# Patient Record
Sex: Female | Born: 1980 | Race: White | Hispanic: No | Marital: Married | Smoking: Never smoker
Health system: Southern US, Community
[De-identification: ages and names within clinical notes are randomized; demographics above are authoritative.]

## PROBLEM LIST (undated history)

## (undated) DIAGNOSIS — R002 Palpitations: Secondary | ICD-10-CM

## (undated) DIAGNOSIS — R55 Syncope and collapse: Secondary | ICD-10-CM

## (undated) DIAGNOSIS — I471 Supraventricular tachycardia, unspecified: Secondary | ICD-10-CM

## (undated) DIAGNOSIS — G47419 Narcolepsy without cataplexy: Secondary | ICD-10-CM

## (undated) DIAGNOSIS — C719 Malignant neoplasm of brain, unspecified: Secondary | ICD-10-CM

## (undated) DIAGNOSIS — D496 Neoplasm of unspecified behavior of brain: Secondary | ICD-10-CM

## (undated) DIAGNOSIS — I951 Orthostatic hypotension: Secondary | ICD-10-CM

## (undated) DIAGNOSIS — I340 Nonrheumatic mitral (valve) insufficiency: Secondary | ICD-10-CM

## (undated) DIAGNOSIS — R0602 Shortness of breath: Secondary | ICD-10-CM

## (undated) DIAGNOSIS — E039 Hypothyroidism, unspecified: Secondary | ICD-10-CM

## (undated) DIAGNOSIS — R42 Dizziness and giddiness: Secondary | ICD-10-CM

## (undated) DIAGNOSIS — R569 Unspecified convulsions: Secondary | ICD-10-CM

## (undated) HISTORY — DX: Nonrheumatic mitral (valve) insufficiency: I34.0

## (undated) HISTORY — DX: Dizziness and giddiness: R42

## (undated) HISTORY — DX: Palpitations: R00.2

## (undated) HISTORY — DX: Hypothyroidism, unspecified: E03.9

## (undated) HISTORY — DX: Syncope and collapse: R55

## (undated) HISTORY — DX: Shortness of breath: R06.02

## (undated) HISTORY — DX: Malignant neoplasm of brain, unspecified: C71.9

## (undated) HISTORY — DX: Orthostatic hypotension: I95.1

## (undated) HISTORY — DX: Narcolepsy without cataplexy: G47.419

---

## 2005-03-25 ENCOUNTER — Ambulatory Visit: Payer: Self-pay | Admitting: Internal Medicine

## 2007-12-05 ENCOUNTER — Emergency Department (HOSPITAL_BASED_OUTPATIENT_CLINIC_OR_DEPARTMENT_OTHER): Admission: EM | Admit: 2007-12-05 | Discharge: 2007-12-05 | Payer: Self-pay | Admitting: Emergency Medicine

## 2008-01-23 ENCOUNTER — Encounter: Admission: RE | Admit: 2008-01-23 | Discharge: 2008-01-23 | Payer: Self-pay | Admitting: Obstetrics and Gynecology

## 2008-05-28 ENCOUNTER — Emergency Department (HOSPITAL_BASED_OUTPATIENT_CLINIC_OR_DEPARTMENT_OTHER): Admission: EM | Admit: 2008-05-28 | Discharge: 2008-05-28 | Payer: Self-pay | Admitting: Emergency Medicine

## 2008-08-02 ENCOUNTER — Emergency Department (HOSPITAL_BASED_OUTPATIENT_CLINIC_OR_DEPARTMENT_OTHER): Admission: EM | Admit: 2008-08-02 | Discharge: 2008-08-02 | Payer: Self-pay | Admitting: Emergency Medicine

## 2010-07-27 LAB — BASIC METABOLIC PANEL
BUN: 10 mg/dL (ref 6–23)
CO2: 27 mEq/L (ref 19–32)
Calcium: 9.8 mg/dL (ref 8.4–10.5)
Creatinine, Ser: 0.8 mg/dL (ref 0.4–1.2)
GFR calc non Af Amer: >60 mL/min (ref >60)
Glucose, Bld: 90 mg/dL (ref 70–99)
Potassium: 4.4 mEq/L (ref 3.5–5.1)

## 2010-07-27 LAB — URINALYSIS, ROUTINE W REFLEX MICROSCOPIC
Bilirubin Urine: NEGATIVE
Hgb urine dipstick: NEGATIVE
Specific Gravity, Urine: 1.002 — ABNORMAL LOW (ref 1.005–1.030)

## 2010-07-27 LAB — GLUCOSE, CAPILLARY: Glucose-Capillary: 92 mg/dL (ref 70–99)

## 2010-12-03 ENCOUNTER — Emergency Department (HOSPITAL_BASED_OUTPATIENT_CLINIC_OR_DEPARTMENT_OTHER)
Admission: EM | Admit: 2010-12-03 | Discharge: 2010-12-03 | Disposition: A | Discharge status: Home / Self Care | Payer: 59 | Attending: Emergency Medicine | Admitting: Emergency Medicine

## 2010-12-03 DIAGNOSIS — N939 Abnormal uterine and vaginal bleeding, unspecified: Secondary | ICD-10-CM

## 2010-12-03 DIAGNOSIS — N898 Other specified noninflammatory disorders of vagina: Secondary | ICD-10-CM | POA: Insufficient documentation

## 2010-12-03 DIAGNOSIS — R42 Dizziness and giddiness: Secondary | ICD-10-CM | POA: Insufficient documentation

## 2010-12-03 HISTORY — DX: Supraventricular tachycardia, unspecified: I47.10

## 2010-12-03 HISTORY — DX: Neoplasm of unspecified behavior of brain: D49.6

## 2010-12-03 HISTORY — DX: Supraventricular tachycardia: I47.1

## 2010-12-03 HISTORY — DX: Unspecified convulsions: R56.9

## 2010-12-03 LAB — WET PREP, GENITAL
Clue Cells Wet Prep HPF POC: NONE SEEN
Trich, Wet Prep: NONE SEEN
Yeast Wet Prep HPF POC: NONE SEEN

## 2010-12-03 LAB — PREGNANCY, URINE: Preg Test, Ur: NEGATIVE

## 2010-12-03 MED ORDER — NORGESTREL-ETHINYL ESTRADIOL 0.3-30 MG-MCG PO TABS: 1.0000 | ORAL_TABLET | Freq: Every day | ORAL | Status: DC

## 2010-12-03 NOTE — Discharge Instructions (Signed)
Dysfunctional Uterine Bleeding (DUB)   (Abnormal Uterine Bleeding [AUB])   Normally menstrual periods begin between ages 20 to 32 in young women. A normal menstrual cycle/period may begin every 23 days up to 35 days and lasts from 1 to 7 days. Around 12 to 14 days before your menstrual period starts, ovulation (ovary produces an egg) occurs. When counting the time between menstrual periods, count from the first day of bleeding of the previous period to the first day of bleeding of the next period.   Dysfunctional (abnormal) uterine bleeding is bleeding that is different from a normal menstrual period. Your periods may come earlier or later than usual. They may be lighter, have blood clots or be heavier. You may have bleeding between periods, or you may skip one period or more. You may have bleeding after sexual intercourse, bleeding after menopause, or no menstrual period.   CAUSES   Pregnancy (normal, miscarriage, tubal).   IUDs (intrauterine device, birth control).   Birth control pills.   Hormone treatment.   Menopause.   Infection of the cervix.   Blood clotting problems.   Infection of the inside lining of the uterus.   Endometriosis, inside lining of the uterus growing in the pelvis and other female organs.   Adhesions (scar tissue) inside the uterus.   Obesity or severe weight loss.  Uterine polyps inside the uterus.   Cancer of the vagina, cervix, or uterus.   Ovarian cysts or polycystic ovary syndrome.   Medical problems (diabetes, thyroid disease).   Uterine fibroids (noncancerous tumor).   Problems with your female hormones.   Endometrial hyperplasia, very thick lining and enlarged cells inside of the uterus.   Medicines that interfere with ovulation.   Radiation to the pelvis or abdomen.   Chemotherapy.    DIAGNOSIS   Your doctor will discuss the history of your menstrual periods, medicines you are taking, changes in your weight, stress in your life, and any medical problems you may have.   Your doctor  will do a physical and pelvic examination.   Your doctor may want to perform certain tests to make a diagnosis, such as:   Pap test.   Blood tests.   Cultures for infection.   CT scan.  Ultrasound.   Hysteroscopy.   Laparoscopy.   MRI.  Hysterosalpingography.   D and C.   Endometrial biopsy.    TREATMENT   Treatment will depend on the cause of the dysfunctional uterine bleeding (DUB). Treatment may include:   Observing your menstrual periods for a couple of months.   Prescribing medicines for medical problems, including:   Antibiotics.   Hormones.   Birth control pills.   Removing an IUD (intrauterine device, birth control).   Surgery:   D and C (scrape and remove tissue from inside the uterus).   Laparoscopy (examine inside the abdomen with a lighted tube).   Uterine ablation (destroy lining of the uterus with electrical current, laser, heat, or freezing).   Hysteroscopy (examine cervix and uterus with a lighted tube).   Hysterectomy (remove the uterus).   HOME CARE INSTRUCTIONS   If medicines were prescribed, take exactly as directed. Do not change or switch medicines without consulting your caregiver.   Long term heavy bleeding may result in iron deficiency. Your caregiver may have prescribed iron pills. They help replace the iron that your body lost from heavy bleeding. Take exactly as directed.   Do not take aspirin or medicines that contain aspirin one week  before or during your menstrual period. Aspirin may make the bleeding worse.   If you need to change your sanitary pad or tampon more than once every 2 hours, stay in bed with your feet elevated and a cold pack on your lower abdomen. Rest as much as possible, until the bleeding stops or slows down.   Eat well-balanced meals. Eat foods high in iron. Examples are:   Leafy green vegetables.   Whole-grain breads and cereals.   Eggs.  Meat.   Liver.    Do not try to lose weight until the abnormal bleeding has stopped and your blood iron level is back to normal.  Do not lift more than ten pounds or do strenuous activities when you are bleeding.   For a couple of months, make note on your calendar, marking the start and ending of your period, and the type of bleeding (light, medium, heavy, spotting, clots or missed periods). This is for your caregiver to better evaluate your problem.   SEEK MEDICAL CARE IF:   You develop nausea (feeling sick to your stomach) and vomiting, dizziness, or diarrhea while you are taking your medicine.   You are getting lightheaded or weak.   You have any problems that may be related to the medicine you are taking.   You develop pain with your DUB.   You want to remove your IUD.   You want to stop or change your birth control pills or hormones.   You have any type of abnormal bleeding mentioned above.   You are over 41 years old and have not had a menstrual period yet.   You are 30 years old and you are still having menstrual periods.   You have any of the symptoms mentioned above.   You develop a rash.   SEEK IMMEDIATE MEDICAL CARE IF:   An oral temperature above 101 develops.   You develop chills.   You are changing your sanitary pad or tampon more than once an hour.   You develop abdominal pain.   You pass out or faint.   Document Released: 03/25/2000 Document Re-Released: 06/22/2009   [facility] Patient Information 2011 [place], [place].

## 2010-12-03 NOTE — ED Notes (Signed)
Pt reports heavy vaginal bleeding that started this am.

## 2010-12-03 NOTE — ED Provider Notes (Signed)
History     CSN: [id number] Arrival date & time: 12/03/2010  9:00 AM  Chief Complaint  Patient presents with  . Vaginal Bleeding   HPI Comments: The patient reports that she had a normal menstrual cycle one month ago and appropriately began her menses yesterday. This morning she woke up and her clothes and bed were fairly saturated with menstrual bleeding. It seemed to slow down and she cleaned up and went back to sleep. She then got up again this morning in order to get ready for work and again was fully saturated. She did take a shower and she notes that heavy bleeding and clots continued to fall out while she was showering. She reports feeling slightly lightheaded but no significant dizziness or feeling faint. She did not have any shortness of breath or diaphoresis or nausea. She reports that usually her menses last approximately 5 days with moderate to heavy bleeding. She notes that she has a family history of heavy menstrual cycles and no family history of bleeding disorders or platelet problems. She denies any abdominal pain, tenderness or back pain. She denies any cramps. Her OB history includes 2 pregnancies and 2 children by vaginal birth. She had a Pap smear and ultrasound approximately 6 months ago that she reports words okay. She was told by her OB/GYN physician Dr. Renaldo Fiddler that she does have possibly a fibroid. The patient used to take birth control pills to help control her ear leading but she discontinued due to side effects of weight gain and moodiness. Patient denies smoking or drinking excessive alcohol. At this time prior to arrival the bleeding did slow down significantly. However at home she did use a tampon and bled through it very quickly.  Patient is a 30 y.o. female presenting with vaginal bleeding. The history is provided by the patient.  Vaginal Bleeding Pertinent negatives include no headaches.    Past Medical History  Diagnosis Date  . Seizure   . Brain tumor   . SVT  (supraventricular tachycardia)     History reviewed. No pertinent past surgical history.  No family history on file.  History  Substance Use Topics  . Smoking status: Never Smoker   . Smokeless tobacco: Not on file  . Alcohol Use: Yes     occasional    OB History    Grav Para Term Preterm Abortions TAB SAB Ect Mult Living                  Review of Systems  Constitutional: Negative.   Respiratory: Negative.   Cardiovascular: Negative.   Genitourinary: Positive for vaginal bleeding and menstrual problem. Negative for dysuria, flank pain, vaginal discharge, vaginal pain and pelvic pain.  Musculoskeletal: Negative for back pain.  Neurological: Positive for light-headedness. Negative for dizziness, weakness and headaches.  Hematological: Does not bruise/bleed easily.    Physical Exam  BP 129/85  Pulse 67  Temp(Src) 98.4 F (36.9 C) (Oral)  Resp 16  Ht 5\' 6"  (1.676 m)  Wt 140 lb (63.504 kg)  BMI 22.60 kg/m2  SpO2 100%  LMP 12/03/2010  Physical Exam  Constitutional: She is oriented to person, place, and time. She appears well-developed and well-nourished. No distress.  HENT:  Head: Normocephalic and atraumatic.  Pulmonary/Chest: Effort normal.  Abdominal: Bowel sounds are normal. There is no tenderness. There is no guarding.  Genitourinary: Cervix exhibits friability. Cervix exhibits no discharge. Right adnexum displays no tenderness. Left adnexum displays no tenderness. No signs of injury around the  vagina. No vaginal discharge found.       Small amount of mucous bleeding noted from os.  Mild to moderate amount of blood in vaginal vault.  Evidence of blood around her pelvis from where her clothes had been saturated with blood.  Neurological: She is alert and oriented to person, place, and time.    ED Course  Procedures  MDM The patient has stable vital signs. On exam the patient's vaginal bleeding has decreased significantly. Her urine pregnancy was negative. I  discussed the patient's case with her regular OB/GYN doctor at. Dr. Renaldo Fiddler had seen her for similar problem 6 months ago the patient had declined birth control to time. We'll put her back on a monophasic birth control for now. Dr. Renaldo Fiddler can see the patient in one to 2 weeks for followup to see her she is doing at that time. He she knows to return for any worsening symptoms or to call Dr. Renaldo Fiddler for followup.      Gavin Pound. Makinzy Cleere, MD 12/03/10 1053

## 2012-01-18 ENCOUNTER — Other Ambulatory Visit: Payer: Self-pay | Admitting: Obstetrics and Gynecology

## 2012-01-18 DIAGNOSIS — N6310 Unspecified lump in the right breast, unspecified quadrant: Secondary | ICD-10-CM

## 2012-01-18 DIAGNOSIS — N644 Mastodynia: Secondary | ICD-10-CM

## 2012-01-23 ENCOUNTER — Ambulatory Visit
Admission: RE | Admit: 2012-01-23 | Discharge: 2012-01-23 | Disposition: A | Discharge status: Home / Self Care | Payer: 59 | Source: Ambulatory Visit | Attending: Obstetrics and Gynecology | Admitting: Obstetrics and Gynecology

## 2012-01-23 DIAGNOSIS — N644 Mastodynia: Secondary | ICD-10-CM

## 2012-01-23 DIAGNOSIS — N6310 Unspecified lump in the right breast, unspecified quadrant: Secondary | ICD-10-CM

## 2013-08-02 ENCOUNTER — Ambulatory Visit (INDEPENDENT_AMBULATORY_CARE_PROVIDER_SITE_OTHER): Payer: PRIVATE HEALTH INSURANCE | Admitting: Pulmonary Disease

## 2013-08-02 ENCOUNTER — Encounter (INDEPENDENT_AMBULATORY_CARE_PROVIDER_SITE_OTHER): Payer: Self-pay

## 2013-08-02 ENCOUNTER — Encounter: Payer: Self-pay | Admitting: Pulmonary Disease

## 2013-08-02 VITALS — BP 110/62 | HR 61 | Temp 98.1°F | Ht 65.0 in | Wt 130.0 lb

## 2013-08-02 DIAGNOSIS — G4710 Hypersomnia, unspecified: Secondary | ICD-10-CM

## 2013-08-02 DIAGNOSIS — G47419 Narcolepsy without cataplexy: Secondary | ICD-10-CM | POA: Insufficient documentation

## 2013-08-02 MED ORDER — ARMODAFINIL 150 MG PO TABS: 150.0000 mg | ORAL_TABLET | Freq: Every day | ORAL | Status: DC

## 2013-08-02 NOTE — Patient Instructions (Signed)
Will try you on nuvigil 150mg  one each am to see if it helps your symptoms.  Please call me in a few weeks to give me some feedback. Make sure you keep close followup with [facility] for management of your brain tumor.  followup with me again in 43mos, but call if having issues.

## 2013-08-02 NOTE — Progress Notes (Signed)
Subjective:    Patient ID: Shirley Anderson, female    DOB: [birth year], 33 y.o.   MRN: [id number]  HPI The patient is a 33 year old female who I've been asked to see for management of significant daytime sleepiness. The patient tells me that she had a workup at [facility] in 2010 where she had a normal overnight sleep study, followed by an abnormal MSLT consistent with narcolepsy.  She was treated with a stimulant medication that she believes helped quite a bit, but was discontinued because of concerns about its effect on her history of cardiac arrhythmia. 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 This was last imaged in February of this year, and felt to have no change.  The patient tells me the diagnosis is not definitive, and that she does have a history of a seizure disorder that may be related to the brain tumor.  The patient has had a recent MSLT that showed a mean sleep latency of 7.2 minutes, and achieved REM and all of naps. She tells me that she has had sleepiness for only one year, but it has accelerated over the last 6 months. She does not feel that she had sleepiness back in her teenage years and during her 57s, but did feel like she had less energy. The patient denies having any issue with snoring or witnessed pauses in her breathing during sleep. She does not kick during the night according to her husband, but her husband has noted about 2 times a year where she has abnormal behaviors. She is not aware of any seizures occurring during the night. She has not had a recent NPSG.  She awakens a few times during the night, and feels fairly rested in the mornings upon arising. However, quickly she begins to develop significant sleepiness with any period of inactivity. She will often go out to her car to take a nap during her lunch break. She believes this is interfering with her concentration and efficiency at work. She  also has a difficult time staying awake watching television in the evening with her family. The patient's weight has been stable over the last few years, and her Epworth score today is very abnormal at 17.   Sleep Questionnaire What time do you typically go to bed?( Between what hours) 8-9:00 p.m 8-9:00 p.m at 1121 on 08/02/13 by Lu Duffel How long does it take you to fall asleep? instantly instantly at 1121 on 08/02/13 by Lu Duffel How many times during the night do you wake up? 5 5 at 1121 on 08/02/13 by Lu Duffel What time do you get out of bed to start your day? 0600 0600 at 1121 on 08/02/13 by Lu Duffel Do you drive or operate heavy machinery in your occupation? No No at 1121 on 08/02/13 by Lu Duffel How much has your weight changed (up or down) over the past two years? (In pounds) 0 oz (0 kg) 0 oz (0 kg) at 1121 on 08/02/13 by Lu Duffel Have you ever had a sleep study before? Yes Yes at 1121 on 08/02/13 by Lu Duffel If yes, location of study? [facility] at 1121 on 08/02/13 by Lu Duffel If yes, date of study? 06/2013 06/2013 at 1121 on 08/02/13 by Lu Duffel Do you currently use CPAP? No No at 1121 on 08/02/13 by Lu Duffel  Do you wear oxygen at any time? No No at 1121 on 08/02/13 by Lu Duffel   Review of Systems  Constitutional: Negative for fever and unexpected weight change.  HENT: Negative for congestion, dental problem, ear pain, nosebleeds, postnasal drip, rhinorrhea, sinus pressure, sneezing, sore throat and trouble swallowing.   Eyes: Negative for redness and itching.  Respiratory: Negative for cough, chest tightness, shortness of breath and wheezing.   Cardiovascular: Positive for palpitations. Negative for leg swelling.  Gastrointestinal: Negative for nausea and vomiting.  Genitourinary: Negative for dysuria.  Musculoskeletal: Negative for joint swelling.   Skin: Negative for rash.  Neurological: Negative for headaches.  Hematological: Does not bruise/bleed easily.  Psychiatric/Behavioral: Negative for dysphoric mood. The patient is not nervous/anxious.        Objective:   Physical Exam Constitutional:  Well developed, no acute distress  HENT:  Nares patent without discharge  Oropharynx without exudate, palate and uvula are mildly elongated.  Eyes:  Perrla, eomi, no scleral icterus  Neck:  No JVD, no TMG  Cardiovascular:  Normal rate, regular rhythm, no rubs or gallops.  No murmurs        Intact distal pulses  Pulmonary :  Normal breath sounds, no stridor or respiratory distress   No rales, rhonchi, or wheezing  Abdominal:  Soft, nondistended, bowel sounds present.  No tenderness noted.   Musculoskeletal:  No lower extremity edema noted.  Lymph Nodes:  No cervical lymphadenopathy noted  Skin:  No cyanosis noted  Neurologic:  Alert, appropriate, moves all 4 extremities without obvious deficit.         Assessment & Plan:

## 2013-08-02 NOTE — Assessment & Plan Note (Addendum)
The patient presents with persistent daytime sleepiness that is interfering with her quality of life and work efficiency. She has had a sleep evaluation in the past that was suspicious for narcolepsy, and her recent MS LT was abnormal as well. She did not have an NPS G. prior to her recent daytime testing, but she has no history to suggest sleep disordered breathing, movement disorders, or obvious nocturnal seizures (though this will need to be kept in mind going forward). 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 She has a grade 2 glioma by her history in the hypothalamus that is being followed closely by [facility]. It was last imaged in February of this year with no change in size. She really doesn't have a history for sleepiness dating back to her teens and 38s, and my suspicion is she does not have genetic-based disease. Will try the patient on Nuvigil to see if that helps her daytime symptoms, also stressed to her the importance of good sleep hygiene, and reviewed other suggestions such as naps during the day when able and modest use of caffeine.  I reviewed the potential side effects with nuvigil, and how it has little to no effect on her cardiovascular health. I also reminded her that it can interfere with birth control pills. Finally, I stressed to her the importance of close followup with [facility] to make sure that her brain tumor is not changing.

## 2013-08-07 ENCOUNTER — Telehealth: Payer: Self-pay | Admitting: Pulmonary Disease

## 2013-08-07 NOTE — Telephone Encounter (Signed)
Noted  

## 2013-08-07 NOTE — Telephone Encounter (Signed)
Pt is on sample 250mg  dose of Nuvigil-she will run out tomorrow and is going today to get the free 30 days supply of 150mg  Nuvigil filled. Pt will watch for same symptoms once she starts taking the 150 mg Nuvigil.   Will route message to [facility] to document once PA forms filled out. Thanks.

## 2013-08-07 NOTE — Telephone Encounter (Signed)
Will just fill out paperwork and see how it goes.  We do not have nuvigil 150mg .  I gave her a card during the visit which usually allows for 30 days free until we go thru this process.  Did this not work? If not, get the paperwork to me to sign and we will go from there.  Did the med work for the Pt?

## 2013-08-07 NOTE — Telephone Encounter (Signed)
[place], I gave the PA information to [place] to have you complete and sign-then we will fax back.   Pt states she can tell a difference in her alertness and feels like she is getting a lot of auras (has hx of seizures)-she would like to know if she should continue the med(would like to try the lower dose that was Rx'd-sample was for 250mg  given at OV).   Pt did get the savings card and understands to have pharmacy run it as a secondary insurance in the meantime while we are working on the [place].

## 2013-08-07 NOTE — Telephone Encounter (Signed)
Pt called stating she is returning Katie's call Per Joellen [place] pt should take the 150mg  free 30 day trial while PA is being done and watch for any symptoms like auras while on the 150mg  Pt has yet to take the free 30 day trial to her pharmacy, but will do this.  If there is an issue with obtaining the free 30 days she is to call the office.  If she experiences any of the same symptoms on the 150mg , she is to call the office.  Will route back to [facility] to document once the PA forms are filled out.

## 2013-08-07 NOTE — Telephone Encounter (Signed)
I do want her on the lower dose.  The card is for FREE 30 day supply while she is waiting on the PA etc.

## 2013-08-07 NOTE — Telephone Encounter (Signed)
Pt is using the Free 30 day card while waiting on PA; Pt is aware that [facility] wants her to be on Nuvigil-pt was just concerned of the side effects and what to expect given her hx of seizures. Please advise [place]. Thanks.

## 2013-08-07 NOTE — Telephone Encounter (Signed)
Agree with advice given

## 2013-08-07 NOTE — Telephone Encounter (Signed)
PA was received by patients pharmacy-I have called [employer] at [phone] and they are faxing forms over as we can not do PA over the phone. I have received forms for [place] to sign and we will fax back. The approval/denial can take up to 48 hours.   [place], we do not have any samples of Nuvigil 150mg  in the office;please advise. Thanks.  I have left a message for patient that we are working on this and will keep her updated as able. In the meantime if any questions or concerns please call our office.

## 2013-08-07 NOTE — Telephone Encounter (Signed)
Want to see if the lower dose causes the same symptoms? I gave her samples of the 250 mg tab when she was here, but wrote script for 150mg .  Is she still taking samples of 250mg  or is she on the 150mg ??

## 2013-08-08 NOTE — Telephone Encounter (Signed)
PA form completed and faxed today. Forms placed at Shirley Anderson's desk to f/u on approval/denial. [place] Bing, CMA

## 2013-08-13 NOTE — Telephone Encounter (Signed)
Have we received an approval or denial on this yet? Thanks.

## 2013-08-14 NOTE — Telephone Encounter (Signed)
I received denial today. I will review it and complete what is needed.[place] Bing, CMA

## 2013-08-20 NOTE — Telephone Encounter (Signed)
Form received and have been filled out, re submitted w/ sleep study, will forward to [facility]

## 2013-08-20 NOTE — Telephone Encounter (Signed)
Called appeals dept at [phone]. Was advised we need to re submit the form. Write on top of form new information added. Submit correct DX and sleep study with form. A new form will be faxed to triage

## 2013-08-21 NOTE — Telephone Encounter (Signed)
Medication approved for lifetime, subject to formulary changes. LMTCB x1 to advise the pt. [place] Bing, CMA ]

## 2013-08-22 NOTE — Telephone Encounter (Signed)
Spoke with pt and advised that medication has been approved for lifetime , subject to formulary changes.

## 2013-08-22 NOTE — Telephone Encounter (Signed)
lmomtcb x 2  

## 2013-08-29 ENCOUNTER — Telehealth: Payer: Self-pay | Admitting: Pulmonary Disease

## 2013-08-29 NOTE — Telephone Encounter (Signed)
LMTCB

## 2013-08-29 NOTE — Telephone Encounter (Addendum)
Pt returned call.  Pt states she will call back later.   Satira Anis

## 2013-08-29 NOTE — Telephone Encounter (Signed)
Spoke with the pt  She states that the nuvigil 150 mg "makes me feel not so good" "like I'm drugged" Then in the pm she crashes  She was discussing this with her neurologist, and he rec that she ask [place] about Vyvanse I advised will send him the msg, but he is out of the office until 09/03/13 She states this is fine  Please advise, thanks!

## 2013-08-30 NOTE — Telephone Encounter (Signed)
I called spoke with pt. Made her aware. She would like to discuss this in more detail with [facility] when he returns next week. She really would like his input and especially since she does not like how the nuvigil makes her feel. I will make [place] aware. Please advise thanks

## 2013-08-30 NOTE — Telephone Encounter (Signed)
Let her know that it is converted to an amphetamine.  Therefore, can have impact on CV status longterm.  Is an option for treatment, but have to accept long term side effects.  Let me know if she is still interested, and we can discuss next week.

## 2013-08-30 NOTE — Telephone Encounter (Signed)
lmtcb

## 2013-08-30 NOTE — Telephone Encounter (Signed)
Pt returned call.  Holly D Pryor ° °

## 2013-09-03 NOTE — Telephone Encounter (Signed)
See if she will come in for an ov to discuss.

## 2013-09-03 NOTE — Telephone Encounter (Signed)
LMOM x 1 

## 2013-09-04 NOTE — Telephone Encounter (Signed)
LMTCB

## 2013-09-04 NOTE — Telephone Encounter (Signed)
Pt called back; she has OV scheduled with [place] on Friday 09-06-13 at 2:45pm to discuss meds. Nothing more needed at this time.

## 2013-09-06 ENCOUNTER — Encounter (INDEPENDENT_AMBULATORY_CARE_PROVIDER_SITE_OTHER): Payer: Self-pay

## 2013-09-06 ENCOUNTER — Encounter: Payer: Self-pay | Admitting: Pulmonary Disease

## 2013-09-06 ENCOUNTER — Ambulatory Visit (INDEPENDENT_AMBULATORY_CARE_PROVIDER_SITE_OTHER): Payer: PRIVATE HEALTH INSURANCE | Admitting: Pulmonary Disease

## 2013-09-06 VITALS — BP 110/70 | HR 61 | Temp 97.7°F | Ht 65.0 in | Wt 132.4 lb

## 2013-09-06 DIAGNOSIS — G4710 Hypersomnia, unspecified: Secondary | ICD-10-CM

## 2013-09-06 NOTE — Patient Instructions (Signed)
Continue on your nuvigil 150mg  1/2 tab in am and 1/2 in pm if working for you Please call me if you wish to try alternatives for your sleepiness if not satisfied with your quality of life.  Cancel upcoming apptm with me and reschedule for 1mos.

## 2013-09-06 NOTE — Progress Notes (Signed)
   Subjective:    Patient ID: Shirley Anderson, female    DOB: 23-Oct-1980, 33 y.o.   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 She has been trying nuvigil, and did not feel right while taking the 150 mg a day dose. She also had breakthrough in the early afternoon. She had wondered about possible alternatives, but is trying to avoid sympathomimetic-type medications. She tried cutting her Nuvigil and half, and taking one in the morning and the other in the afternoon, and has done very well with this. She is able to function well at work and also in the early evening at home.   Review of Systems  Constitutional: Negative for fever and unexpected weight change.  HENT: Negative for congestion, dental problem, ear pain, nosebleeds, postnasal drip, rhinorrhea, sinus pressure, sneezing, sore throat and trouble swallowing.   Eyes: Negative for redness and itching.  Respiratory: Negative for cough, chest tightness, shortness of breath and wheezing.   Cardiovascular: Negative for palpitations and leg swelling.  Gastrointestinal: Negative for nausea and vomiting.  Genitourinary: Negative for dysuria.  Musculoskeletal: Negative for joint swelling.  Skin: Negative for rash.  Neurological: Negative for headaches.  Hematological: Does not bruise/bleed easily.  Psychiatric/Behavioral: Negative for dysphoric mood. The patient is not nervous/anxious.        Objective:   Physical Exam Well-developed female in no acute distress Lower extremities without edema, no cyanosis Alert and oriented, does not appear to be sleepy, moves all 4 extremities.       Assessment & Plan:

## 2013-09-06 NOTE — Assessment & Plan Note (Signed)
The patient felt abnormal while taking Nuvigil at 150 mg a day, but when she cut the pill in half and took 75mg  in am and the other half after lunch, she has done well.  She feels that her alertness during the day is adequate for both her work and at home in the late afternoon.  I have talked with her about alternatives, but all of these will have potential sympathetic side effects. She would like to continue with her current regimen, and see how she does over time. If she would like to try alternative medications, I would be happy to work with her on this.

## 2013-09-18 ENCOUNTER — Telehealth: Payer: Self-pay | Admitting: Pulmonary Disease

## 2013-09-18 MED ORDER — AMPHETAMINE-DEXTROAMPHETAMINE 10 MG PO TABS: ORAL_TABLET | ORAL | Status: DC

## 2013-09-18 NOTE — Telephone Encounter (Signed)
Called and spoke with pt and she is aware of Ursina recs to change to the adderall 10 mg.  Pt is aware that rx will be left up front and she can pick this up this afternoon.  Nothing further is needed.

## 2013-09-18 NOTE — Telephone Encounter (Signed)
Called and spoke with pt and she stated that she started with a rash in late May.  Pt stated that she didn't know where this rash came from so she was seen by Derm.  They did a biopsy and she was told that this was a drug allergy.  They are thinking that this is from the nuvigil since this is the only new med that she has started recently.  Derm recs to stop the nuvigil for a trial to see if the rash clears up but the pt wanted to check with [facility] before doing this.  Pt stated that the nuvigil worked great for her but would like to try something different.  [place] please advise thanks  Allergies  Allergen Reactions  . Keppra [Levetiracetam]     psychosis  . Lamictal [Lamotrigine]     Rash    Current Outpatient Prescriptions on File Prior to Visit  Medication Sig Dispense Refill  . Armodafinil (NUVIGIL) 150 MG tablet Take 1 tablet (150 mg total) by mouth daily.  30 tablet  4  . atenolol (TENORMIN) 25 MG tablet Take 25 mg by mouth 2 (two) times daily.      . calcium-vitamin D (OSCAL WITH D) 250-125 MG-UNIT per tablet Take 1 tablet by mouth daily.      . Cholecalciferol (VITAMIN D) 2000 UNITS CAPS Take 1 capsule by mouth daily.      Marland Kitchen levothyroxine (SYNTHROID, LEVOTHROID) 50 MCG tablet Take 50 mcg by mouth daily before breakfast.      . Magnesium 250 MG TABS Take 1 tablet by mouth daily.      . Multiple Vitamin (MULTI VITAMIN DAILY PO) Take 1 tablet by mouth daily.      Marland Kitchen topiramate (TOPAMAX) 100 MG tablet Take 150 mg by mouth 2 (two) times daily.       . vitamin B-12 (CYANOCOBALAMIN) 1000 MCG tablet Take 1,500 mcg by mouth daily.       No current facility-administered medications on file prior to visit.

## 2013-09-18 NOTE — Telephone Encounter (Signed)
Suspect not nuvigil, but need to find out.  Start on adderall 10mg  each am, but can increase to 20mg  each am AFTER ONE WEEK IF STILL HAVING SYMPTOMS.  #60, no fills.

## 2013-10-14 ENCOUNTER — Telehealth: Payer: Self-pay | Admitting: Pulmonary Disease

## 2013-10-14 NOTE — Telephone Encounter (Signed)
Ok with me 

## 2013-10-14 NOTE — Telephone Encounter (Signed)
lmomtcb x1 

## 2013-10-14 NOTE — Telephone Encounter (Signed)
Pt returned call.   Spoke with patient who is requesting a refill on the Adderral 10mg .  She stated that taking 2 tabs is working very well for her and she likes and tolerates this much better than the Nuvigil.  She would like to pick this up.  Dr Gwenette Greet please advise if okay to fill the Adderral 10mg .    Med last rx'd on 6.10.15 per the 6.10.15 phone note: Kathee Delton, MD at 09/18/2013 12:25 PM     Suspect not nuvigil, but need to find out.  Start on adderall 10mg  each am, but can increase to 20mg  each am AFTER ONE WEEK IF STILL HAVING SYMPTOMS.  #60, no fills.

## 2013-10-15 MED ORDER — AMPHETAMINE-DEXTROAMPHETAMINE 10 MG PO TABS: ORAL_TABLET | ORAL | Status: DC

## 2013-10-15 NOTE — Telephone Encounter (Signed)
lmomtcb x 2  

## 2013-10-15 NOTE — Telephone Encounter (Signed)
Pt returned call.  Advised Rx was printed, signed & placed up front for pick up.  Pt verbalized understanding & states her husband will pick this up.  Nothing further needed at this time.  Per Dewitt Hoes, ok to close message.  Satira Anis

## 2013-10-15 NOTE — Telephone Encounter (Signed)
lmomtcb x1   RX printed, signed and placed upfront for pick up

## 2013-11-06 ENCOUNTER — Encounter (HOSPITAL_COMMUNITY)
Admission: EM | Disposition: A | Discharge status: Home / Self Care | Payer: Self-pay | Source: Home / Self Care | Attending: Emergency Medicine

## 2013-11-06 ENCOUNTER — Observation Stay (HOSPITAL_BASED_OUTPATIENT_CLINIC_OR_DEPARTMENT_OTHER)
Admission: EM | Admit: 2013-11-06 | Discharge: 2013-11-07 | Disposition: A | Discharge status: Home / Self Care | Payer: PRIVATE HEALTH INSURANCE | Attending: General Surgery | Admitting: General Surgery

## 2013-11-06 ENCOUNTER — Encounter (HOSPITAL_COMMUNITY): Payer: PRIVATE HEALTH INSURANCE | Admitting: Certified Registered"

## 2013-11-06 ENCOUNTER — Encounter (HOSPITAL_BASED_OUTPATIENT_CLINIC_OR_DEPARTMENT_OTHER): Payer: Self-pay | Admitting: Emergency Medicine

## 2013-11-06 ENCOUNTER — Emergency Department (HOSPITAL_BASED_OUTPATIENT_CLINIC_OR_DEPARTMENT_OTHER): Payer: PRIVATE HEALTH INSURANCE

## 2013-11-06 ENCOUNTER — Observation Stay (HOSPITAL_COMMUNITY): Payer: PRIVATE HEALTH INSURANCE | Admitting: Certified Registered"

## 2013-11-06 DIAGNOSIS — R1031 Right lower quadrant pain: Secondary | ICD-10-CM | POA: Diagnosis present

## 2013-11-06 DIAGNOSIS — E039 Hypothyroidism, unspecified: Secondary | ICD-10-CM | POA: Diagnosis not present

## 2013-11-06 DIAGNOSIS — G40909 Epilepsy, unspecified, not intractable, without status epilepticus: Secondary | ICD-10-CM | POA: Insufficient documentation

## 2013-11-06 DIAGNOSIS — G47419 Narcolepsy without cataplexy: Secondary | ICD-10-CM | POA: Insufficient documentation

## 2013-11-06 DIAGNOSIS — D72829 Elevated white blood cell count, unspecified: Secondary | ICD-10-CM

## 2013-11-06 DIAGNOSIS — K3580 Unspecified acute appendicitis: Secondary | ICD-10-CM | POA: Diagnosis not present

## 2013-11-06 DIAGNOSIS — I498 Other specified cardiac arrhythmias: Secondary | ICD-10-CM | POA: Diagnosis not present

## 2013-11-06 DIAGNOSIS — K352 Acute appendicitis with generalized peritonitis, without abscess: Secondary | ICD-10-CM

## 2013-11-06 DIAGNOSIS — K37 Unspecified appendicitis: Secondary | ICD-10-CM | POA: Diagnosis present

## 2013-11-06 DIAGNOSIS — N390 Urinary tract infection, site not specified: Secondary | ICD-10-CM | POA: Diagnosis not present

## 2013-11-06 HISTORY — PX: LAPAROSCOPIC APPENDECTOMY: SHX408

## 2013-11-06 LAB — CBC WITH DIFFERENTIAL/PLATELET
BASOS PCT: 0 % (ref 0–1)
Basophils Absolute: 0 10*3/uL (ref 0.0–0.1)
Eosinophils Absolute: 0.1 10*3/uL (ref 0.0–0.7)
Eosinophils Relative: 1 % (ref 0–5)
HCT: 41 % (ref 36.0–46.0)
Hemoglobin: 14 g/dL (ref 12.0–15.0)
LYMPHS ABS: 1.8 10*3/uL (ref 0.7–4.0)
LYMPHS PCT: 14 % (ref 12–46)
MCH: 29.3 pg (ref 26.0–34.0)
MCHC: 34.1 g/dL (ref 30.0–36.0)
MCV: 85.8 fL (ref 78.0–100.0)
MONO ABS: 1 10*3/uL (ref 0.1–1.0)
MONOS PCT: 8 % (ref 3–12)
NEUTROS ABS: 9.7 10*3/uL — AB (ref 1.7–7.7)
NEUTROS PCT: 77 % (ref 43–77)
PLATELETS: 209 10*3/uL (ref 150–400)
RBC: 4.78 MIL/uL (ref 3.87–5.11)
RDW: 13.5 % (ref 11.5–15.5)
WBC: 12.6 10*3/uL — AB (ref 4.0–10.5)

## 2013-11-06 LAB — URINALYSIS, ROUTINE W REFLEX MICROSCOPIC
BILIRUBIN URINE: NEGATIVE
Glucose, UA: NEGATIVE mg/dL
Hgb urine dipstick: NEGATIVE
Ketones, ur: NEGATIVE mg/dL
NITRITE: NEGATIVE
PH: 8.5 — AB (ref 5.0–8.0)
Protein, ur: 30 mg/dL — AB
Specific Gravity, Urine: 1.017 (ref 1.005–1.030)
Urobilinogen, UA: 0.2 mg/dL (ref 0.0–1.0)

## 2013-11-06 LAB — COMPREHENSIVE METABOLIC PANEL
ALBUMIN: 5 g/dL (ref 3.5–5.2)
ALT: 15 U/L (ref 0–35)
ANION GAP: 18 — AB (ref 5–15)
AST: 15 U/L (ref 0–37)
Alkaline Phosphatase: 47 U/L (ref 39–117)
BILIRUBIN TOTAL: 0.7 mg/dL (ref 0.3–1.2)
BUN: 6 mg/dL (ref 6–23)
CO2: 20 mEq/L (ref 19–32)
Calcium: 10.2 mg/dL (ref 8.4–10.5)
Chloride: 102 mEq/L (ref 96–112)
Creatinine, Ser: 0.7 mg/dL (ref 0.50–1.10)
GFR calc Af Amer: >90 mL/min (ref >90)
Glucose, Bld: 104 mg/dL — ABNORMAL HIGH (ref 70–99)
Potassium: 3.5 mEq/L — ABNORMAL LOW (ref 3.7–5.3)
SODIUM: 140 meq/L (ref 137–147)
Total Protein: 8.9 g/dL — ABNORMAL HIGH (ref 6.0–8.3)

## 2013-11-06 LAB — URINE MICROSCOPIC-ADD ON

## 2013-11-06 LAB — PREGNANCY, URINE: PREG TEST UR: NEGATIVE

## 2013-11-06 SURGERY — APPENDECTOMY, LAPAROSCOPIC
Anesthesia: General | Site: Abdomen

## 2013-11-06 MED ORDER — MAGNESIUM GLUCONATE 500 MG PO TABS
500.0000 mg | ORAL_TABLET | Freq: Every day | ORAL | Status: DC
Start: 2013-11-07 — End: 2013-11-07
  Administered 2013-11-07: 500 mg via ORAL
  Filled 2013-11-06: qty 1

## 2013-11-06 MED ORDER — ONDANSETRON HCL 4 MG/2ML IJ SOLN
4.0000 mg | Freq: Once | INTRAMUSCULAR | Status: AC
  Administered 2013-11-06: 4 mg via INTRAVENOUS
  Filled 2013-11-06: qty 2

## 2013-11-06 MED ORDER — HYDROMORPHONE HCL PF 1 MG/ML IJ SOLN
1.0000 mg | Freq: Once | INTRAMUSCULAR | Status: AC
  Administered 2013-11-06: 1 mg via INTRAVENOUS
  Filled 2013-11-06: qty 1

## 2013-11-06 MED ORDER — FENTANYL CITRATE 0.05 MG/ML IJ SOLN
INTRAMUSCULAR | Status: DC | PRN
  Administered 2013-11-06: 100 ug via INTRAVENOUS
  Administered 2013-11-06: 50 ug via INTRAVENOUS

## 2013-11-06 MED ORDER — HYDROMORPHONE HCL PF 1 MG/ML IJ SOLN
0.2500 mg | INTRAMUSCULAR | Status: DC | PRN
  Administered 2013-11-06 (×2): 0.5 mg via INTRAVENOUS

## 2013-11-06 MED ORDER — ONDANSETRON HCL 4 MG/2ML IJ SOLN
INTRAMUSCULAR | Status: DC | PRN
  Administered 2013-11-06: 4 mg via INTRAVENOUS

## 2013-11-06 MED ORDER — SUCCINYLCHOLINE CHLORIDE 20 MG/ML IJ SOLN
INTRAMUSCULAR | Status: DC | PRN
  Administered 2013-11-06: 100 mg via INTRAVENOUS

## 2013-11-06 MED ORDER — AMPHETAMINE-DEXTROAMPHETAMINE 10 MG PO TABS
20.0000 mg | ORAL_TABLET | Freq: Every day | ORAL | Status: DC
  Administered 2013-11-07: 20 mg via ORAL
  Filled 2013-11-06: qty 2

## 2013-11-06 MED ORDER — MAGNESIUM 250 MG PO TABS: 500.0000 mg | ORAL_TABLET | Freq: Every day | ORAL | Status: DC

## 2013-11-06 MED ORDER — MIDAZOLAM HCL 5 MG/5ML IJ SOLN
INTRAMUSCULAR | Status: DC | PRN
  Administered 2013-11-06: 2 mg via INTRAVENOUS

## 2013-11-06 MED ORDER — DIPHENHYDRAMINE HCL 12.5 MG/5ML PO ELIX: 12.5000 mg | ORAL_SOLUTION | Freq: Four times a day (QID) | ORAL | Status: DC | PRN

## 2013-11-06 MED ORDER — IOHEXOL 300 MG/ML  SOLN
100.0000 mL | Freq: Once | INTRAMUSCULAR | Status: AC | PRN
  Administered 2013-11-06: 100 mL via INTRAVENOUS

## 2013-11-06 MED ORDER — DEXTROSE 5 % IV SOLN
1.0000 g | INTRAVENOUS | Status: AC
  Administered 2013-11-06: 1 g via INTRAVENOUS
  Filled 2013-11-06: qty 1

## 2013-11-06 MED ORDER — BUPIVACAINE-EPINEPHRINE (PF) 0.25% -1:200000 IJ SOLN
INTRAMUSCULAR | Status: AC
  Filled 2013-11-06: qty 30

## 2013-11-06 MED ORDER — DEXTROSE 5 % IV SOLN
INTRAVENOUS | Status: AC
  Filled 2013-11-06: qty 1

## 2013-11-06 MED ORDER — CEFTRIAXONE SODIUM 1 G IJ SOLR
INTRAMUSCULAR | Status: AC
  Filled 2013-11-06: qty 10

## 2013-11-06 MED ORDER — OXYCODONE HCL 5 MG/5ML PO SOLN: 5.0000 mg | Freq: Once | ORAL | Status: DC | PRN

## 2013-11-06 MED ORDER — ARMODAFINIL 150 MG PO TABS
150.0000 mg | ORAL_TABLET | Freq: Every day | ORAL | Status: DC
  Filled 2013-11-06: qty 1

## 2013-11-06 MED ORDER — MIDAZOLAM HCL 2 MG/2ML IJ SOLN
INTRAMUSCULAR | Status: AC
  Filled 2013-11-06: qty 2

## 2013-11-06 MED ORDER — 0.9 % SODIUM CHLORIDE (POUR BTL) OPTIME
TOPICAL | Status: DC | PRN
  Administered 2013-11-06: 1000 mL

## 2013-11-06 MED ORDER — ATENOLOL 25 MG PO TABS
25.0000 mg | ORAL_TABLET | Freq: Two times a day (BID) | ORAL | Status: DC
  Filled 2013-11-06 (×3): qty 1

## 2013-11-06 MED ORDER — ONDANSETRON HCL 4 MG/2ML IJ SOLN: 4.0000 mg | Freq: Once | INTRAMUSCULAR | Status: DC | PRN

## 2013-11-06 MED ORDER — LIDOCAINE HCL (CARDIAC) 20 MG/ML IV SOLN
INTRAVENOUS | Status: DC | PRN
  Administered 2013-11-06: 5 mg via INTRAVENOUS

## 2013-11-06 MED ORDER — GLYCOPYRROLATE 0.2 MG/ML IJ SOLN
INTRAMUSCULAR | Status: DC | PRN
  Administered 2013-11-06: .7 mg via INTRAVENOUS

## 2013-11-06 MED ORDER — BUPIVACAINE-EPINEPHRINE 0.25% -1:200000 IJ SOLN
INTRAMUSCULAR | Status: DC | PRN
  Administered 2013-11-06: 30 mL

## 2013-11-06 MED ORDER — OXYCODONE HCL 5 MG PO TABS: 5.0000 mg | ORAL_TABLET | Freq: Once | ORAL | Status: DC | PRN

## 2013-11-06 MED ORDER — DIPHENHYDRAMINE HCL 50 MG/ML IJ SOLN: 12.5000 mg | Freq: Four times a day (QID) | INTRAMUSCULAR | Status: DC | PRN

## 2013-11-06 MED ORDER — PROPOFOL 10 MG/ML IV BOLUS
INTRAVENOUS | Status: AC
  Filled 2013-11-06: qty 20

## 2013-11-06 MED ORDER — MEPERIDINE HCL 25 MG/ML IJ SOLN: 6.2500 mg | INTRAMUSCULAR | Status: DC | PRN

## 2013-11-06 MED ORDER — HYDROCODONE-ACETAMINOPHEN 5-325 MG PO TABS
1.0000 | ORAL_TABLET | ORAL | Status: DC | PRN
  Administered 2013-11-06 – 2013-11-07 (×2): 2 via ORAL
  Filled 2013-11-06 (×2): qty 2

## 2013-11-06 MED ORDER — SODIUM CHLORIDE 0.9 % IV BOLUS (SEPSIS)
1000.0000 mL | Freq: Once | INTRAVENOUS | Status: AC
  Administered 2013-11-06: 1000 mL via INTRAVENOUS

## 2013-11-06 MED ORDER — TOPIRAMATE 100 MG PO TABS
100.0000 mg | ORAL_TABLET | Freq: Two times a day (BID) | ORAL | Status: DC
  Administered 2013-11-06 – 2013-11-07 (×2): 100 mg via ORAL
  Filled 2013-11-06 (×3): qty 1

## 2013-11-06 MED ORDER — DEXTROSE 5 % IV SOLN
1.0000 g | Freq: Once | INTRAVENOUS | Status: AC
Start: 2013-11-06 — End: 2013-11-06
  Administered 2013-11-06: 1 g via INTRAVENOUS

## 2013-11-06 MED ORDER — HYDROMORPHONE HCL PF 1 MG/ML IJ SOLN
INTRAMUSCULAR | Status: AC
  Filled 2013-11-06: qty 1

## 2013-11-06 MED ORDER — PROPOFOL 10 MG/ML IV BOLUS
INTRAVENOUS | Status: DC | PRN
  Administered 2013-11-06: 150 mg via INTRAVENOUS

## 2013-11-06 MED ORDER — POTASSIUM CHLORIDE IN NACL 20-0.9 MEQ/L-% IV SOLN
INTRAVENOUS | Status: DC
  Administered 2013-11-06 – 2013-11-07 (×2): via INTRAVENOUS
  Filled 2013-11-06 (×4): qty 1000

## 2013-11-06 MED ORDER — LACTATED RINGERS IV SOLN
INTRAVENOUS | Status: DC | PRN
  Administered 2013-11-06 (×2): via INTRAVENOUS

## 2013-11-06 MED ORDER — SODIUM CHLORIDE 0.9 % IR SOLN
Status: DC | PRN
  Administered 2013-11-06: 1000 mL

## 2013-11-06 MED ORDER — LEVOTHYROXINE SODIUM 50 MCG PO TABS
50.0000 ug | ORAL_TABLET | ORAL | Status: DC
  Administered 2013-11-07: 50 ug via ORAL
  Filled 2013-11-06: qty 1

## 2013-11-06 MED ORDER — LEVOTHYROXINE SODIUM 50 MCG PO TABS: 50.0000 ug | ORAL_TABLET | Freq: Every day | ORAL | Status: DC

## 2013-11-06 MED ORDER — ROCURONIUM BROMIDE 100 MG/10ML IV SOLN
INTRAVENOUS | Status: DC | PRN
  Administered 2013-11-06: 20 mg via INTRAVENOUS

## 2013-11-06 MED ORDER — NEOSTIGMINE METHYLSULFATE 10 MG/10ML IV SOLN
INTRAVENOUS | Status: DC | PRN
  Administered 2013-11-06: 4 mg via INTRAVENOUS

## 2013-11-06 MED ORDER — FENTANYL CITRATE 0.05 MG/ML IJ SOLN
INTRAMUSCULAR | Status: AC
  Filled 2013-11-06: qty 5

## 2013-11-06 MED ORDER — LEVOTHYROXINE SODIUM 100 MCG PO TABS
100.0000 ug | ORAL_TABLET | ORAL | Status: DC
  Filled 2013-11-06: qty 1

## 2013-11-06 MED ORDER — IOHEXOL 300 MG/ML  SOLN
50.0000 mL | Freq: Once | INTRAMUSCULAR | Status: AC | PRN
  Administered 2013-11-06: 50 mL via ORAL

## 2013-11-06 SURGICAL SUPPLY — 42 items
APPLIER CLIP ROT 10 11.4 M/L (STAPLE)
BLADE SURG ROTATE 9660 (MISCELLANEOUS) IMPLANT
CANISTER SUCTION 2500CC (MISCELLANEOUS) IMPLANT
CHLORAPREP W/TINT 26ML (MISCELLANEOUS) ×3 IMPLANT
CLIP APPLIE ROT 10 11.4 M/L (STAPLE) IMPLANT
CLOSURE WOUND 1/4 X3 (GAUZE/BANDAGES/DRESSINGS) ×1
COVER SURGICAL LIGHT HANDLE (MISCELLANEOUS) ×3 IMPLANT
CUTTER LINEAR ENDO 35 ETS (STAPLE) ×3 IMPLANT
CUTTER LINEAR ENDO 35 ETS TH (STAPLE) IMPLANT
DERMABOND ADVANCED (GAUZE/BANDAGES/DRESSINGS) ×2
DERMABOND ADVANCED .7 DNX12 (GAUZE/BANDAGES/DRESSINGS) ×1 IMPLANT
DRAPE UTILITY 15X26 W/TAPE STR (DRAPE) ×6 IMPLANT
DRSG TEGADERM 2-3/8X2-3/4 SM (GAUZE/BANDAGES/DRESSINGS) ×9 IMPLANT
ELECT REM PT RETURN 9FT ADLT (ELECTROSURGICAL) ×3
ELECTRODE REM PT RTRN 9FT ADLT (ELECTROSURGICAL) ×1 IMPLANT
ENDOLOOP SUT PDS II  0 18 (SUTURE)
ENDOLOOP SUT PDS II 0 18 (SUTURE) IMPLANT
GLOVE BIOGEL PI IND STRL 8 (GLOVE) ×1 IMPLANT
GLOVE BIOGEL PI INDICATOR 8 (GLOVE) ×2
GLOVE ECLIPSE 7.5 STRL STRAW (GLOVE) ×3 IMPLANT
GOWN STRL REUS W/ TWL LRG LVL3 (GOWN DISPOSABLE) ×3 IMPLANT
GOWN STRL REUS W/TWL LRG LVL3 (GOWN DISPOSABLE) ×6
KIT BASIN OR (CUSTOM PROCEDURE TRAY) ×3 IMPLANT
KIT ROOM TURNOVER OR (KITS) ×3 IMPLANT
NS IRRIG 1000ML POUR BTL (IV SOLUTION) ×3 IMPLANT
PAD ARMBOARD 7.5X6 YLW CONV (MISCELLANEOUS) ×3 IMPLANT
PENCIL BUTTON HOLSTER BLD 10FT (ELECTRODE) IMPLANT
POUCH SPECIMEN RETRIEVAL 10MM (ENDOMECHANICALS) ×3 IMPLANT
RELOAD /EVU35 (ENDOMECHANICALS) IMPLANT
RELOAD CUTTER ETS 35MM STAND (ENDOMECHANICALS) IMPLANT
SCALPEL HARMONIC ACE (MISCELLANEOUS) ×3 IMPLANT
SET IRRIG TUBING LAPAROSCOPIC (IRRIGATION / IRRIGATOR) ×3 IMPLANT
SPECIMEN JAR SMALL (MISCELLANEOUS) ×3 IMPLANT
STRIP CLOSURE SKIN 1/4X3 (GAUZE/BANDAGES/DRESSINGS) ×2 IMPLANT
SUT MNCRL AB 4-0 PS2 18 (SUTURE) ×3 IMPLANT
TOWEL OR 17X24 6PK STRL BLUE (TOWEL DISPOSABLE) ×3 IMPLANT
TOWEL OR 17X26 10 PK STRL BLUE (TOWEL DISPOSABLE) ×3 IMPLANT
TRAY FOLEY CATH 16FR SILVER (SET/KITS/TRAYS/PACK) ×3 IMPLANT
TRAY LAPAROSCOPIC (CUSTOM PROCEDURE TRAY) ×3 IMPLANT
TROCAR XCEL 12X100 BLDLESS (ENDOMECHANICALS) ×3 IMPLANT
TROCAR XCEL BLUNT TIP 100MML (ENDOMECHANICALS) ×3 IMPLANT
TROCAR XCEL NON-BLD 5MMX100MML (ENDOMECHANICALS) ×3 IMPLANT

## 2013-11-06 NOTE — Progress Notes (Addendum)
Patient admitted from PACU for appendectomy.  Alert and oriented x 4.  VS stable.  Pt. Up from bed to chair and talking to the phone.  Mother inside the room. 3 wound sites in abdomen clean, dry and intact with adhesive dressing.

## 2013-11-06 NOTE — Op Note (Signed)
OPERATIVE REPORT  DATE OF OPERATION: 11/06/2013  PATIENT:  Shirley Anderson  33 y.o. female  PRE-OPERATIVE DIAGNOSIS:  Appendicitis  POST-OPERATIVE DIAGNOSIS:  Appendicitis  PROCEDURE:  Procedure(s): APPENDECTOMY LAPAROSCOPIC  SURGEON:  Surgeon(s): Gwenyth Ober, MD  ASSISTANT: None  ANESTHESIA:   general  EBL: <20 ml  BLOOD ADMINISTERED: none  DRAINS: none   SPECIMEN:  Source of Specimen:  Appendix  COUNTS CORRECT:  YES  PROCEDURE DETAILS: The patient was taken to the operating room and placed on the table in the supine position. After an adequate general endotracheal anesthetic was administered, she was prepped and draped in the usual sterile manner exposing the entire abdomen.  A proper timeout was performed identifying the patient and the procedure to be performed. An infraumbilical midline incision was made using a #15 blade and taken down to the midline fascia. We incised the midline fascia using a #15 blade and bluntly dissected down into the peritoneal cavity. A pursestring suture of 0 Vicryl was passed around the fascial opening which subsequently was used to secure in place a Hassan cannula. Carbon dioxide gas was insufflated through the Hassan cannula into the peritoneal cavity up to a maximal intra-abdominal pressure of 15 mm of mercury.  Rate dissectedSubsequently two 5 mm cannulas were passed under direct vision in the right upper quadrant and a left low quadrant. The patient was placed in Trendelenburg position and the left eye was ptotic down.  The appendix was noted to be coursing medially from the base of the cecum to the midline. There were lateral attachments which were taken down using electrocautery and a dissector. We were able to dissect out the base of the appendix at the cecum from the mesoappendix. We came across the mesoappendix and controlled bleeding using a Harmonic Scalpel. Subsequently we were able to pass a blue cartridge Endo GIA through the Moca  cannula around the base of the appendix. This detached the appendix from the base of the cecum which was subsequently retrieved through the 12 mm cannula site.  Once the appendix was removed we irrigated the right lower quadrant and around the abdomen using saline solution. We then flattened the patient on the table and then closed the infraumbilical fascial site using the pursestring suture which is in place. 0.25% Marcaine with epinephrine was injected in the skin at all sites. The skin was closed at the infraumbilical site using a running subcuticular stitch of 4-0 Monocryl. All needle counts, sponge counts, and instrument counts were correct.  PATIENT DISPOSITION:  PACU - hemodynamically stable.   Gwenyth Ober 7/29/20155:10 PM

## 2013-11-06 NOTE — Anesthesia Postprocedure Evaluation (Signed)
  Anesthesia Post-op Note  Patient: Shirley Anderson  Procedure(s) Performed: Procedure(s): APPENDECTOMY LAPAROSCOPIC (N/A)  Patient Location: PACU  Anesthesia Type:General  Level of Consciousness: awake, alert , oriented and patient cooperative  Airway and Oxygen Therapy: Patient Spontanous Breathing  Post-op Pain: none  Post-op Assessment: Post-op Vital signs reviewed, Patient's Cardiovascular Status Stable, Respiratory Function Stable, Patent Airway, No signs of Nausea or vomiting and Pain level controlled  Post-op Vital Signs: Reviewed and stable  Last Vitals:  Filed Vitals:   11/06/13 1815  BP:   Pulse:   Temp: 37 C  Resp:     Complications: No apparent anesthesia complications

## 2013-11-06 NOTE — H&P (Signed)
Patient with acute appendicitis. For OR ASAP.  Shirley Anderson. Dahlia Bailiff, MD, [place] [phone] [facility]

## 2013-11-06 NOTE — ED Notes (Signed)
Pt here from [place] for appendicitis.

## 2013-11-06 NOTE — ED Notes (Signed)
Report called to [place] at [facility]

## 2013-11-06 NOTE — H&P (Signed)
[facility] Admission Note  Shirley Anderson 1980-08-01  [id number].    Requesting MD: Dr. Fayrene Fearing Chief Complaint/Reason for Consult: Acute appendicitis  HPI:  33 y/o female with PMH seizure disorder, brain tumor of the hypothalamus, narcolepsy, and SVT presented to [facility] c/o periumbilical/suprapubic pain which radiates to the right back which started on 11/05/13.  Pain is moderate, sudden onset and constant.  C/o nausea and sweats.  No CP/SOB, fever/chills, no neurologic symptoms or prodrome to her normal seizures.  Last seizure was 2 months ago, they seem to be controlled by Topamax.  Was found to have a UTI and was treated with Rocephin, but a CT was obtained which showed acute appendicitis and leukocytosis.  She was accepted in transfer to [facility] hospital by Dr. Lindie Spruce.  She did not take her atenolol this morning because she was supposed to have a cardiac monitoring study today.  ROS: All systems reviewed and otherwise negative except for as above  Family History  Problem Relation Age of Onset  . Heart disease Father   . Kidney disease Sister     Past Medical History  Diagnosis Date  . Seizure   . Brain tumor   . SVT (supraventricular tachycardia)     History reviewed. No pertinent past surgical history.  Social History:  reports that she has never smoked. She does not have any smokeless tobacco history on file. She reports that she drinks alcohol. She reports that she does not use illicit drugs.  Allergies:  Allergies  Allergen Reactions  . Keppra [Levetiracetam]     psychosis  . Lamictal [Lamotrigine]     Rash     (Not in a hospital admission)  Blood pressure 116/86, pulse 89, temperature 98 F (36.7 C), temperature source Oral, resp. rate 18, height 5\' 5"  (1.651 m), weight 125 lb (56.7 kg), last menstrual period 10/27/2013, SpO2 100.00%. Physical Exam: General: pleasant, WD/WN white female who is laying in bed in NAD HEENT: head is normocephalic, atraumatic.   Sclera are noninjected/nonicteric.  PERRL.  Ears and nose without any masses or lesions.  Mouth is pink and moist. TM pearly gray +light reflex. Heart: regular, rate, and rhythm.  No obvious murmurs, gallops, or rubs noted.  Palpable pedal pulses bilaterally Lungs: CTAB, no wheezes, rhonchi, or rales noted.  Respiratory effort nonlabored Abd: Flat, diffusely tender thus we did not palpate/percuss, +BS, no hernias visible, early peritoneal signs noted by previous MD MS: all 4 extremities are symmetrical with no cyanosis, clubbing, or edema. Skin: warm and dry with no masses, lesions, or rashes Psych: A&Ox3 with an appropriate affect.   Results for orders placed during the hospital encounter of 11/06/13 (from the past 48 hour(s))  CBC WITH DIFFERENTIAL     Status: Abnormal   Collection Time    11/06/13  9:53 AM      Result Value Ref Range   WBC 12.6 (*) 4.0 - 10.5 K/uL   RBC 4.78  3.87 - 5.11 MIL/uL   Hemoglobin 14.0  12.0 - 15.0 g/dL   HCT 11/08/13  48.4 - 74.2 %   MCV 85.8  78.0 - 100.0 fL   MCH 29.3  26.0 - 34.0 pg   MCHC 34.1  30.0 - 36.0 g/dL   RDW 29.4  63.9 - 20.2 %   Platelets 209  150 - 400 K/uL   Neutrophils Relative % 77  43 - 77 %   Neutro Abs 9.7 (*) 1.7 - 7.7 K/uL   Lymphocytes Relative 14  12 - 46 %   Lymphs Abs 1.8  0.7 - 4.0 K/uL   Monocytes Relative 8  3 - 12 %   Monocytes Absolute 1.0  0.1 - 1.0 K/uL   Eosinophils Relative 1  0 - 5 %   Eosinophils Absolute 0.1  0.0 - 0.7 K/uL   Basophils Relative 0  0 - 1 %   Basophils Absolute 0.0  0.0 - 0.1 K/uL  COMPREHENSIVE METABOLIC PANEL     Status: Abnormal   Collection Time    11/06/13  9:53 AM      Result Value Ref Range   Sodium 140  137 - 147 mEq/L   Potassium 3.5 (*) 3.7 - 5.3 mEq/L   Chloride 102  96 - 112 mEq/L   CO2 20  19 - 32 mEq/L   Glucose, Bld 104 (*) 70 - 99 mg/dL   BUN 6  6 - 23 mg/dL   Creatinine, Ser 0.70  0.50 - 1.10 mg/dL   Calcium 10.2  8.4 - 10.5 mg/dL   Total Protein 8.9 (*) 6.0 - 8.3 g/dL    Albumin 5.0  3.5 - 5.2 g/dL   AST 15  0 - 37 U/L   ALT 15  0 - 35 U/L   Alkaline Phosphatase 47  39 - 117 U/L   Total Bilirubin 0.7  0.3 - 1.2 mg/dL   GFR calc non Af Amer >90  >90 mL/min   GFR calc Af Amer >90  >90 mL/min   Comment: (NOTE)     The eGFR has been calculated using the CKD EPI equation.     This calculation has not been validated in all clinical situations.     eGFR's persistently <90 mL/min signify possible Chronic Kidney     Disease.   Anion gap 18 (*) 5 - 15  URINALYSIS, ROUTINE W REFLEX MICROSCOPIC     Status: Abnormal   Collection Time    11/06/13 10:06 AM      Result Value Ref Range   Color, Urine YELLOW  YELLOW   APPearance CLOUDY (*) CLEAR   Specific Gravity, Urine 1.017  1.005 - 1.030   pH 8.5 (*) 5.0 - 8.0   Glucose, UA NEGATIVE  NEGATIVE mg/dL   Hgb urine dipstick NEGATIVE  NEGATIVE   Bilirubin Urine NEGATIVE  NEGATIVE   Ketones, ur NEGATIVE  NEGATIVE mg/dL   Protein, ur 30 (*) NEGATIVE mg/dL   Urobilinogen, UA 0.2  0.0 - 1.0 mg/dL   Nitrite NEGATIVE  NEGATIVE   Leukocytes, UA SMALL (*) NEGATIVE  PREGNANCY, URINE     Status: None   Collection Time    11/06/13 10:06 AM      Result Value Ref Range   Preg Test, Ur NEGATIVE  NEGATIVE   Comment:            THE SENSITIVITY OF THIS     METHODOLOGY IS >20 mIU/mL.  URINE MICROSCOPIC-ADD ON     Status: Abnormal   Collection Time    11/06/13 10:06 AM      Result Value Ref Range   Squamous Epithelial / LPF FEW (*) RARE   WBC, UA 7-10  <3 WBC/hpf   RBC / HPF 0-2  <3 RBC/hpf   Bacteria, UA MANY (*) RARE   Ct Abdomen Pelvis W Contrast  11/06/2013   CLINICAL DATA:  Pain.  Nausea.  EXAM: CT ABDOMEN AND PELVIS WITH CONTRAST  TECHNIQUE: Multidetector CT imaging of the abdomen and pelvis was performed  using the standard protocol following bolus administration of intravenous contrast.  CONTRAST:  93mL OMNIPAQUE IOHEXOL 300 MG/ML SOLN, 168mL OMNIPAQUE IOHEXOL 300 MG/ML SOLN  COMPARISON:  None.  FINDINGS: No focal  hepatic abnormality. Spleen normal. Pancreas normal. No biliary distention. Gallbladder nondistended.  Adrenals normal. No focal renal abnormality. No hydronephrosis or obstructing ureteral stone. Uterus and adnexa unremarkable. No free pelvic fluid.  No significant adenopathy. Abdominal aorta is widely patent. Distal vessels are patent.  The appendix measures 13 mm in diameter. Thickened appendiceal wall is present a probable appendicolith is present in the orifice of the appendix. These findings are consistent with appendicitis. No evidence of bowel obstruction. No free air. No mesenteric mass. No significant hernia.  Lung bases are clear. Heart size is normal. No acute bony abnormality.  IMPRESSION: Findings consistent with appendicitis. These results were called by telephone at the time of interpretation on 11/06/2013 at 1:28 pm to Dr. Tanna Furry , who verbally acknowledged these results.   Electronically Signed   By: Marcello Moores  Register   On: 11/06/2013 13:29      Assessment/Plan Acute appendicitis Leukocytosis - 12.6 UTI - treated with rocephin Seizures - topamax H/o brain tumor SVT - on atenolol Narcolepsy - on adderall Hypothyroidism - on synthroid  Plan: 1.  Admit to [facility] for urgent lap appy today with Dr. Hulen Skains 2.  NPO, IVF, pain control, antiemetics, antibiotics (cefoxitin) 3.  Discussed risks/benefits of procedure including bleeding, infection, injury to intra-abdominal structures, intra-abodominal infection if found to be perforated, and conversion to open.  The patient wishes to proceed with surgery.     Coralie Keens, [facility] 11/06/2013, 2:31 PM Pager: [phone]

## 2013-11-06 NOTE — ED Notes (Signed)
Reports nausea no vomiting. RLQ pain to suprapubic. Feels need to have BM more often.

## 2013-11-06 NOTE — Anesthesia Procedure Notes (Signed)
Procedure Name: Intubation Date/Time: 11/06/2013 4:13 PM Performed by: Eligha Bridegroom Pre-anesthesia Checklist: Emergency Drugs available, Timeout performed, Patient identified, Patient being monitored and Suction available Patient Re-evaluated:Patient Re-evaluated prior to inductionOxygen Delivery Method: Circle system utilized Preoxygenation: Pre-oxygenation with 100% oxygen Intubation Type: IV induction, Cricoid Pressure applied and Rapid sequence Laryngoscope Size: Mac and 3 Grade View: Grade I Tube type: Oral Tube size: 7.0 mm Airway Equipment and Method: Stylet Placement Confirmation: ETT inserted through vocal cords under direct vision,  breath sounds checked- equal and bilateral and positive ETCO2 Secured at: 21 cm Tube secured with: Tape Dental Injury: Teeth and Oropharynx as per pre-operative assessment

## 2013-11-06 NOTE — Anesthesia Preprocedure Evaluation (Addendum)
Anesthesia Evaluation  Patient identified by MRN, date of birth, ID band Patient awake    Reviewed: Allergy & Precautions, H&P , NPO status , Patient's Chart, lab work & pertinent test results, reviewed documented beta blocker date and time   Airway Mallampati: II TM Distance: >3 FB Neck ROM: Full    Dental  (+) Teeth Intact   Pulmonary    Pulmonary exam normal       Cardiovascular + dysrhythmias Supra Ventricular Tachycardia Rhythm:Regular Rate:Normal     Neuro/Psych Seizures -, Well Controlled,  H/O Hypothalamic brain tumor followed at [facility]. H/O Narcolepsy on Nuvigil    GI/Hepatic Neg liver ROS,   Endo/Other  Hypothyroidism   Renal/GU negative Renal ROS     Musculoskeletal negative musculoskeletal ROS (+)   Abdominal (+)  Abdomen: tender.    Peds  Hematology negative hematology ROS (+)   Anesthesia Other Findings Narcolepsy                                                                            Narcolepsy         Reproductive/Obstetrics negative OB ROS                         Anesthesia Physical Anesthesia Plan  ASA: II and emergent  Anesthesia Plan: General   Post-op Pain Management:    Induction:   Airway Management Planned:   Additional Equipment:   Intra-op Plan:   Post-operative Plan:   Informed Consent:   Plan Discussed with:   Anesthesia Plan Comments:         Anesthesia Quick Evaluation

## 2013-11-06 NOTE — ED Notes (Signed)
Dr.Wyatt at the bedside 

## 2013-11-06 NOTE — ED Provider Notes (Signed)
CSN: [id number]     Arrival date & time 11/06/13  0935 History   First MD Initiated Contact with Patient 11/06/13 0940     No chief complaint on file.    (Consider location/radiation/quality/duration/timing/severity/associated sxs/prior Treatment) Patient is a 33 y.o. female presenting with abdominal pain.  Abdominal Pain Pain location:  Periumbilical, RUQ and RLQ Pain radiates to:  Suprapubic region and back Pain severity:  Severe Onset quality:  Sudden Duration:  1 day Timing:  Constant Progression:  Unchanged Chronicity:  New Associated symptoms: nausea   Associated symptoms: no chills, no constipation, no diarrhea, no fever, no shortness of breath and no vomiting     Past Medical History  Diagnosis Date  . Seizure   . Brain tumor   . SVT (supraventricular tachycardia)    No past surgical history on file. Family History  Problem Relation Age of Onset  . Heart disease Father   . Kidney disease Sister    History  Substance Use Topics  . Smoking status: Never Smoker   . Smokeless tobacco: Not on file  . Alcohol Use: Yes     Comment: occasional   OB History   Grav Para Term Preterm Abortions TAB SAB Ect Mult Living                 Review of Systems  Constitutional: Positive for diaphoresis. Negative for fever and chills.  Respiratory: Negative for shortness of breath.   Gastrointestinal: Positive for nausea and abdominal pain. Negative for vomiting, diarrhea, constipation and blood in stool.  All other systems reviewed and are negative.     Allergies  Keppra and Lamictal  Home Medications   Prior to Admission medications   Medication Sig Start Date End Date Taking? Authorizing Provider  amphetamine-dextroamphetamine (ADDERALL) 10 MG tablet take 2 tablets by mouth every morning 10/15/13   Kathee Delton, MD  Armodafinil (NUVIGIL) 150 MG tablet Take 1 tablet (150 mg total) by mouth daily. 08/02/13   Kathee Delton, MD  atenolol (TENORMIN) 25 MG tablet Take 25  mg by mouth 2 (two) times daily.    Historical Provider, MD  calcium-vitamin D (OSCAL WITH D) 250-125 MG-UNIT per tablet Take 1 tablet by mouth daily.    Historical Provider, MD  Cholecalciferol (VITAMIN D) 2000 UNITS CAPS Take 1 capsule by mouth daily.    Historical Provider, MD  levothyroxine (SYNTHROID, LEVOTHROID) 50 MCG tablet Take 50 mcg by mouth daily before breakfast.    Historical Provider, MD  Magnesium 250 MG TABS Take 1 tablet by mouth daily.    Historical Provider, MD  Multiple Vitamin (MULTI VITAMIN DAILY PO) Take 1 tablet by mouth daily.    Historical Provider, MD  topiramate (TOPAMAX) 100 MG tablet Take 150 mg by mouth 2 (two) times daily.     Historical Provider, MD  vitamin B-12 (CYANOCOBALAMIN) 1000 MCG tablet Take 1,500 mcg by mouth daily.    Historical Provider, MD   BP 118/81  Pulse 72  Temp(Src) 97.5 F (36.4 C) (Oral)  Resp 16  Ht 5\' 5"  (1.651 m)  Wt 125 lb (56.7 kg)  BMI 20.80 kg/m2  SpO2 100%  Physical Exam  Constitutional: She is oriented to person, place, and time. She appears well-developed and well-nourished. She appears distressed.  Eyes: Conjunctivae and EOM are normal. Pupils are equal, round, and reactive to light.  Pulmonary/Chest: Effort normal.  Abdominal: Soft. Normal appearance and bowel sounds are normal. She exhibits no distension. There is tenderness in  the suprapubic area, left upper quadrant and left lower quadrant. There is no rigidity, no rebound and no guarding.  Musculoskeletal: Normal range of motion.  Neurological: She is alert and oriented to person, place, and time.  Skin: Skin is warm.    ED Course  Procedures (including critical care time) Labs Review Labs Reviewed  CBC WITH DIFFERENTIAL - Abnormal; Notable for the following:    WBC 12.6 (*)    Neutro Abs 9.7 (*)    All other components within normal limits  COMPREHENSIVE METABOLIC PANEL - Abnormal; Notable for the following:    Potassium 3.5 (*)    Glucose, Bld 104 (*)     Total Protein 8.9 (*)    Anion gap 18 (*)    All other components within normal limits  URINALYSIS, ROUTINE W REFLEX MICROSCOPIC - Abnormal; Notable for the following:    APPearance CLOUDY (*)    pH 8.5 (*)    Protein, ur 30 (*)    Leukocytes, UA SMALL (*)    All other components within normal limits  URINE MICROSCOPIC-ADD ON - Abnormal; Notable for the following:    Squamous Epithelial / LPF FEW (*)    Bacteria, UA MANY (*)    All other components within normal limits  PREGNANCY, URINE    Imaging Review Ct Abdomen Pelvis W Contrast  11/06/2013   CLINICAL DATA:  Pain.  Nausea.  EXAM: CT ABDOMEN AND PELVIS WITH CONTRAST  TECHNIQUE: Multidetector CT imaging of the abdomen and pelvis was performed using the standard protocol following bolus administration of intravenous contrast.  CONTRAST:  103mL OMNIPAQUE IOHEXOL 300 MG/ML SOLN, 135mL OMNIPAQUE IOHEXOL 300 MG/ML SOLN  COMPARISON:  None.  FINDINGS: No focal hepatic abnormality. Spleen normal. Pancreas normal. No biliary distention. Gallbladder nondistended.  Adrenals normal. No focal renal abnormality. No hydronephrosis or obstructing ureteral stone. Uterus and adnexa unremarkable. No free pelvic fluid.  No significant adenopathy. Abdominal aorta is widely patent. Distal vessels are patent.  The appendix measures 13 mm in diameter. Thickened appendiceal wall is present a probable appendicolith is present in the orifice of the appendix. These findings are consistent with appendicitis. No evidence of bowel obstruction. No free air. No mesenteric mass. No significant hernia.  Lung bases are clear. Heart size is normal. No acute bony abnormality.  IMPRESSION: Findings consistent with appendicitis. These results were called by telephone at the time of interpretation on 11/06/2013 at 1:28 pm to Dr. Tanna Furry , who verbally acknowledged these results.   Electronically Signed   By: Marcello Moores  Register   On: 11/06/2013 13:29     EKG Interpretation None       MDM   Final diagnoses:  Acute appendicitis with generalized peritonitis   Patient with stable vitals, afebrile, appears in visible distress. CBC significant for mildly elevated WBC to 12.6. Potassium of 3.5. Urinalysis suggest UTI. Pain controlled with dilaudid IV and patient given ceftriaxone 1g for UTI. Severe symptoms not consistent with lab findings and CT abdomen/pelvis obtained revealing signs consistent with appendicitis. General surgery on call at [facility] called. Report given to Erby Pian, NP for transfer and management. Attending physician, Dr. Stark Klein. Patient assessed before transfer and stable.    Cordelia Poche, MD 11/06/13 Bosie Helper

## 2013-11-06 NOTE — Transfer of Care (Signed)
Immediate Anesthesia Transfer of Care Note  Patient: Shirley Anderson  Procedure(s) Performed: Procedure(s): APPENDECTOMY LAPAROSCOPIC (N/A)  Patient Location: PACU  Anesthesia Type:General  Level of Consciousness: awake and alert   Airway & Oxygen Therapy: Patient Spontanous Breathing and Patient connected to nasal cannula oxygen  Post-op Assessment: Report given to PACU RN and Post -op Vital signs reviewed and stable  Post vital signs: Reviewed and stable  Complications: No apparent anesthesia complications

## 2013-11-06 NOTE — Progress Notes (Signed)
IV apart @ connection when called to pt. 's room ...significant amt. blood on floor.blood pressure lying 94/51,sitting 86/55 denies dizziness.up to [facility] without incident.Atenolol held @ this time

## 2013-11-06 NOTE — ED Provider Notes (Addendum)
PT seen and evaluated.  D/W Dr. Lonny Prude. Pt with right mid abdominal tenderness. + UA. No hematuria. Norma.biliary enzymes.  Increased WBC.  Agree with Ct, although history not classic for Appendicitis, exam has right lower tenderness.   13:41:  Tanna Furry, MD 11/06/13 1224  Tanna Furry, MD 11/08/13 [phone]

## 2013-11-07 ENCOUNTER — Encounter (HOSPITAL_COMMUNITY): Payer: Self-pay | Admitting: General Surgery

## 2013-11-07 MED ORDER — HYDROCODONE-ACETAMINOPHEN 5-325 MG PO TABS: 1.0000 | ORAL_TABLET | ORAL | Status: DC | PRN

## 2013-11-07 NOTE — Progress Notes (Signed)
Utilization Review Completed.Donne Anon T7/30/2015

## 2013-11-07 NOTE — Discharge Summary (Signed)
[facility] Discharge Summary   Patient ID: Shirley Anderson MRN: [id number] DOB/AGE: [birth year] 35 y.o.  Admit date: 11/06/2013 Discharge date: 11/07/2013  Admitting Diagnosis: Acute appendicitis  Discharge Diagnosis Patient Active Problem List   Diagnosis Date Noted  . Appendicitis 11/06/2013  . Hypersomnia 08/02/2013    Consultants None  Imaging: Ct Abdomen Pelvis W Contrast  11/06/2013   CLINICAL DATA:  Pain.  Nausea.  EXAM: CT ABDOMEN AND PELVIS WITH CONTRAST  TECHNIQUE: Multidetector CT imaging of the abdomen and pelvis was performed using the standard protocol following bolus administration of intravenous contrast.  CONTRAST:  88mL OMNIPAQUE IOHEXOL 300 MG/ML SOLN, 175mL OMNIPAQUE IOHEXOL 300 MG/ML SOLN  COMPARISON:  None.  FINDINGS: No focal hepatic abnormality. Spleen normal. Pancreas normal. No biliary distention. Gallbladder nondistended.  Adrenals normal. No focal renal abnormality. No hydronephrosis or obstructing ureteral stone. Uterus and adnexa unremarkable. No free pelvic fluid.  No significant adenopathy. Abdominal aorta is widely patent. Distal vessels are patent.  The appendix measures 13 mm in diameter. Thickened appendiceal wall is present a probable appendicolith is present in the orifice of the appendix. These findings are consistent with appendicitis. No evidence of bowel obstruction. No free air. No mesenteric mass. No significant hernia.  Lung bases are clear. Heart size is normal. No acute bony abnormality.  IMPRESSION: Findings consistent with appendicitis. These results were called by telephone at the time of interpretation on 11/06/2013 at 1:28 pm to Dr. Tanna Furry , who verbally acknowledged these results.   Electronically Signed   By: Marcello Moores  Register   On: 11/06/2013 13:29    Procedures Dr. Hulen Skains (11/07/13) - Laparoscopic Appendectomy  Hospital Course:  33 y/o female with PMH seizure disorder, brain tumor of the hypothalamus, narcolepsy, and  SVT presented to [facility] c/o periumbilical/suprapubic pain which radiates to the right back which started on 11/05/13. Pain is moderate, sudden onset and constant. C/o nausea and sweats. No CP/SOB, fever/chills, no neurologic symptoms or prodrome to her normal seizures. Last seizure was 2 months ago, they seem to be controlled by Topamax. Was found to have a UTI and was treated with Rocephin, but a CT was obtained which showed acute appendicitis and leukocytosis. She was accepted in transfer to [facility] hospital by Dr. Hulen Skains. She did not take her atenolol this morning because she was supposed to have a cardiac monitoring study today.  Patient was admitted and urgently underwent procedure listed above.  Tolerated procedure well and was transferred to the floor.  Diet was advanced as tolerated.  On POD #1, the patient was voiding well, tolerating diet, ambulating well, pain well controlled, vital signs stable, incisions c/d/i and felt stable for discharge home.  Patient will follow up in our office in 3 weeks and knows to call with questions or concerns.  Physical Exam: General:  Alert, NAD, pleasant, comfortable Abd:  Soft, ND, mild tenderness, incisions C/D/I    Medication List         amphetamine-dextroamphetamine 10 MG tablet  Commonly known as:  ADDERALL  Take 20 mg by mouth daily with breakfast.     atenolol 25 MG tablet  Commonly known as:  TENORMIN  Take 25 mg by mouth 2 (two) times daily.     cholecalciferol 1000 UNITS tablet  Commonly known as:  VITAMIN D  Take 1,000 Units by mouth daily.     HYDROcodone-acetaminophen 5-325 MG per tablet  Commonly known as:  NORCO/VICODIN  Take 1-2 tablets by mouth every 4 (four) hours  as needed for moderate pain.     levothyroxine 50 MCG tablet  Commonly known as:  SYNTHROID, LEVOTHROID  Take 50-100 mcg by mouth daily before breakfast. Take 4mcg by mouth on Sunday, Tuesday, Wednesday, Thursday and Saturday. Take 199mcg by mouth on Monday and Friday.      Magnesium 250 MG Tabs  Take 500 mg by mouth daily.     MULTI VITAMIN DAILY PO  Take 1 tablet by mouth daily.     topiramate 100 MG tablet  Commonly known as:  TOPAMAX  Take 100 mg by mouth 2 (two) times daily.     vitamin B-12 1000 MCG tablet  Commonly known as:  CYANOCOBALAMIN  Take 1,500 mcg by mouth every other day.         Follow-up Information   Follow up with [facility] [place] On 12/03/2013. (For post-operation check. Your appointment is at 2:15pm, please arrive at least 30 min before your appointment to complete your check in paperwork.  If you are unable to arrive 30 min prior to your appointment time we may have to cancel or reschedule you)    Contact information:   [address]   [place] [address] [phone]       Signed: Excell Seltzer [facility] [phone]  11/07/2013, 7:22 AM

## 2013-11-07 NOTE — Discharge Instructions (Signed)
Your appointment is at 2:15pm, please arrive at least 30 min before your appointment to complete your check in paperwork.  If you are unable to arrive 30 min prior to your appointment time we may have to cancel or reschedule you.  LAPAROSCOPIC SURGERY: POST OP INSTRUCTIONS  1. DIET: Follow a light bland diet the first 24 hours after arrival home, such as soup, liquids, crackers, etc. Be sure to include lots of fluids daily. Avoid fast food or heavy meals as your are more likely to get nauseated. Eat a low fat the next few days after surgery.  2. Take your usually prescribed home medications unless otherwise directed. 3. PAIN CONTROL:  a. Pain is best controlled by a usual combination of three different methods TOGETHER:  i. Ice/Heat ii. Over the counter pain medication iii. Prescription pain medication b. Most patients will experience some swelling and bruising around the incisions. Ice packs or heating pads (30-60 minutes up to 6 times a day) will help. Use ice for the first few days to help decrease swelling and bruising, then switch to heat to help relax tight/sore spots and speed recovery. Some people prefer to use ice alone, heat alone, alternating between ice & heat. Experiment to what works for you. Swelling and bruising can take several weeks to resolve.  c. It is helpful to take an over-the-counter pain medication regularly for the first few weeks. Choose one of the following that works best for you:  i. Naproxen (Aleve, etc) Two 220mg  tabs twice a day ii. Ibuprofen (Advil, etc) Three 200mg  tabs four times a day (every meal & bedtime) iii. Acetaminophen (Tylenol, etc) 500-650mg  four times a day (every meal & bedtime) d. A prescription for pain medication (such as oxycodone, hydrocodone, etc) should be given to you upon discharge. Take your pain medication as prescribed.  i. If you are having problems/concerns with the prescription medicine (does not control pain, nausea, vomiting, rash,  itching, etc), please call us [phone] to see if we need to switch you to a different pain medicine that will work better for you and/or control your side effect better. ii. If you need a refill on your pain medication, please contact your pharmacy. They will contact our office to request authorization. Prescriptions will not be filled after 5 pm or on week-ends. 4. Avoid getting constipated. Between the surgery and the pain medications, it is common to experience some constipation. Increasing fluid intake and taking a fiber supplement (such as Metamucil, Citrucel, FiberCon, MiraLax, etc) 1-2 times a day regularly will usually help prevent this problem from occurring. A mild laxative (prune juice, Milk of Magnesia, MiraLax, etc) should be taken according to package directions if there are no bowel movements after 48 hours.  5. Watch out for diarrhea. If you have many loose bowel movements, simplify your diet to bland foods & liquids for a few days. Stop any stool softeners and decrease your fiber supplement. Switching to mild anti-diarrheal medications (Kayopectate, Pepto Bismol) can help. If this worsens or does not improve, please call us. 6. Wash / shower every day. You may shower over the dressings as they are waterproof. Continue to shower over incision(s) after the dressing is off. 7. Remove your waterproof bandages 5 days after surgery. You may leave the incision open to air. You may replace a dressing/Band-Aid to cover the incision for comfort if you wish.  8. ACTIVITIES as tolerated:  a. You may resume regular (light) daily activities beginning the next day--such as  daily self-care, walking, climbing stairs--gradually increasing activities as tolerated. If you can walk 30 minutes without difficulty, it is safe to try more intense activity such as jogging, treadmill, bicycling, low-impact aerobics, swimming, etc. b. Save the most intensive and strenuous activity for last such as sit-ups, heavy  lifting, contact sports, etc Refrain from any heavy lifting or straining until you are off narcotics for pain control.  c. DO NOT PUSH THROUGH PAIN. Let pain be your guide: If it hurts to do something, don't do it. Pain is your body warning you to avoid that activity for another week until the pain goes down. d. You may drive when you are no longer taking prescription pain medication, you can comfortably wear a seatbelt, and you can safely maneuver your car and apply brakes. e. Dennis Bast may have sexual intercourse when it is comfortable.  9. FOLLOW UP in our office  a. Please call [facility] at [phone] to set up an appointment to see your surgeon in the office for a follow-up appointment approximately 2-3 weeks after your surgery. b. Make sure that you call for this appointment the day you arrive home to insure a convenient appointment time.      10. IF YOU HAVE DISABILITY OR FAMILY LEAVE FORMS, BRING THEM TO THE               OFFICE FOR PROCESSING.   WHEN TO CALL us [phone]:  1. Poor pain control 2. Reactions / problems with new medications (rash/itching, nausea, etc)  3. Fever over 101.5 F (38.5 C) 4. Inability to urinate 5. Nausea and/or vomiting 6. Worsening swelling or bruising 7. Continued bleeding from incision. 8. Increased pain, redness, or drainage from the incision  The clinic staff is available to answer your questions during regular business hours (8:30am-5pm). Please dont hesitate to call and ask to speak to one of our nurses for clinical concerns.  If you have a medical emergency, go to the nearest emergency room or call 911.  A surgeon from [facility] is always on call at the [facility], [place], [place], [place], [place] [address] ?  MAIN: [phone] ? TOLL FREE: [phone] ?  FAX [phone]  [email or web address]

## 2013-11-07 NOTE — Discharge Summary (Signed)
Did well.  Shirley Anderson. Dahlia Bailiff, MD, [place] [phone] [facility]

## 2013-11-07 NOTE — Progress Notes (Signed)
Discussed discharge summary with patient. Reviewed all medications with patient. Educated patient on incision care. Patient received work note from MD. Patient did not have any questions. Patient ready for discharge and waiting on ride.

## 2013-11-12 NOTE — ED Provider Notes (Signed)
I saw and evaluated the patient, reviewed the resident's note and I agree with the findings and plan.   EKG Interpretation None      Pt seen and examined.  History of abdominal pain over 24 hors.  TTP suprapubic and rlq abdomen.  CT with + appendicitis.  Agree with assessment, and plan for surgical eval.  Tanna Furry, MD 11/12/13 1530

## 2013-11-26 ENCOUNTER — Telehealth: Payer: Self-pay | Admitting: Pulmonary Disease

## 2013-11-26 NOTE — Telephone Encounter (Signed)
Ok to fill 

## 2013-11-26 NOTE — Telephone Encounter (Signed)
Pt calling back a/b prescript for adderall and they want to pick it up.Shirley Anderson

## 2013-11-26 NOTE — Telephone Encounter (Signed)
LMTCB-need to know if patient wants mailed or pick up; then send to [facility] to approve Rx refill. Thanks.

## 2013-11-26 NOTE — Telephone Encounter (Signed)
Pt is calling to get a refill of the adderall 10 mg tablets.  Disautel please advise if you are ok to refill.  thanks

## 2013-11-27 MED ORDER — AMPHETAMINE-DEXTROAMPHETAMINE 10 MG PO TABS: 20.0000 mg | ORAL_TABLET | Freq: Every day | ORAL | Status: DC

## 2013-11-27 NOTE — Telephone Encounter (Signed)
rx at front, pt aware. Morrill Bing, CMA

## 2013-11-27 NOTE — Telephone Encounter (Signed)
Pt called back made her aware that rx given to [facility] to sign and that we will let her know when it is ready for pick-up.Hillery Hunter

## 2013-11-27 NOTE — Telephone Encounter (Signed)
I have given Rx to [facility] to have [facility] sign then call patient to let her know its ready for pick up.

## 2013-12-03 ENCOUNTER — Encounter (INDEPENDENT_AMBULATORY_CARE_PROVIDER_SITE_OTHER): Payer: Self-pay | Admitting: General Surgery

## 2013-12-03 ENCOUNTER — Ambulatory Visit (INDEPENDENT_AMBULATORY_CARE_PROVIDER_SITE_OTHER): Payer: PRIVATE HEALTH INSURANCE | Admitting: General Surgery

## 2013-12-03 VITALS — BP 118/70 | HR 68 | Temp 98.4°F | Ht 65.0 in | Wt 122.0 lb

## 2013-12-03 DIAGNOSIS — K352 Acute appendicitis with generalized peritonitis, without abscess: Secondary | ICD-10-CM

## 2013-12-03 DIAGNOSIS — K35209 Acute appendicitis with generalized peritonitis, without abscess, unspecified as to perforation: Secondary | ICD-10-CM

## 2013-12-03 NOTE — Progress Notes (Signed)
[facility] Sep 14, 1980 [id number] 12/03/2013   History of Present Illness: Shirley Anderson is a  33 y.o. female who presents today status post lap appy by Dr. Judeth Horn.  Pathology reveals acute and suppurative appendicitis and serositis.  The patient is tolerating a regular diet, having normal bowel movements, which are diarrhea, but normal for her, has good pain control.  She  is back to most normal activities.  She does complain of crampy abdominal pain in multiple locations below her umbilicus after each meal.  She denies fevers, chills, nausea, or emesis.  Physical Exam: Abd: soft, nontender, active bowel sounds, nondistended.  All incisions are well healed.  Impression: 1.  Acute appendicitis, s/p lap appy  Plan: She  is able to return to normal activities. After d/w Dr. Ninfa Linden and the patient, she was given 2 options.  1) proceed with a CT scan. 2) watch for another 3 weeks and to call us if it persists, then proceed with a CT scan.  Call sooner if she develops any other symptoms such as fever, worsening abdominal pain, nausea, vomiting.  She would like to give it 3 more weeks prior to any further work up.  She will call if she needs further follow up.

## 2013-12-03 NOTE — Patient Instructions (Signed)
Call if persistent crampy abdominal pain past 3 more weeks May try OTC gas relief to see if that helps

## 2013-12-19 ENCOUNTER — Telehealth: Payer: Self-pay | Admitting: Pulmonary Disease

## 2013-12-20 MED ORDER — AMPHETAMINE-DEXTROAMPHETAMINE 10 MG PO TABS: 20.0000 mg | ORAL_TABLET | Freq: Every day | ORAL | Status: DC

## 2013-12-20 NOTE — Telephone Encounter (Signed)
Mychart refill request sent by pt on 9.10.15: Message     Original authorizing provider: Kathee Delton, MD        Shirley Anderson would like a refill of the following medications:    amphetamine-dextroamphetamine (ADDERALL) 10 MG tablet Kathee Delton, MD]        Preferred pharmacy: [id number] - [place], [place]        Comment:    I currently take this as BID QAM, will need Rx written for enough (30-day supply)  I can be reached via cell [phone] or via portal.  Thank you.   [name]    Pt last seen 5.29.15 by [facility] Adderall last refilled 8.19.15 #60 Dr Gwenette Greet please advise if okay to refill, thank you.

## 2013-12-20 NOTE — Telephone Encounter (Signed)
Ok to get 60 with no refill.  Do not have to keep sending this to me since is maintenance therapy.

## 2013-12-30 ENCOUNTER — Encounter (HOSPITAL_BASED_OUTPATIENT_CLINIC_OR_DEPARTMENT_OTHER): Payer: Self-pay | Admitting: Emergency Medicine

## 2013-12-30 ENCOUNTER — Emergency Department (HOSPITAL_BASED_OUTPATIENT_CLINIC_OR_DEPARTMENT_OTHER): Payer: PRIVATE HEALTH INSURANCE

## 2013-12-30 ENCOUNTER — Emergency Department (HOSPITAL_BASED_OUTPATIENT_CLINIC_OR_DEPARTMENT_OTHER)
Admission: EM | Admit: 2013-12-30 | Discharge: 2013-12-30 | Disposition: A | Discharge status: Home / Self Care | Payer: PRIVATE HEALTH INSURANCE | Attending: Emergency Medicine | Admitting: Emergency Medicine

## 2013-12-30 DIAGNOSIS — Z86011 Personal history of benign neoplasm of the brain: Secondary | ICD-10-CM | POA: Diagnosis not present

## 2013-12-30 DIAGNOSIS — Z79899 Other long term (current) drug therapy: Secondary | ICD-10-CM | POA: Insufficient documentation

## 2013-12-30 DIAGNOSIS — Y9389 Activity, other specified: Secondary | ICD-10-CM | POA: Diagnosis not present

## 2013-12-30 DIAGNOSIS — G40909 Epilepsy, unspecified, not intractable, without status epilepticus: Secondary | ICD-10-CM | POA: Diagnosis not present

## 2013-12-30 DIAGNOSIS — R0789 Other chest pain: Secondary | ICD-10-CM

## 2013-12-30 DIAGNOSIS — Y92410 Unspecified street and highway as the place of occurrence of the external cause: Secondary | ICD-10-CM | POA: Insufficient documentation

## 2013-12-30 DIAGNOSIS — S298XXA Other specified injuries of thorax, initial encounter: Secondary | ICD-10-CM | POA: Diagnosis not present

## 2013-12-30 LAB — CBC WITH DIFFERENTIAL/PLATELET
BASOS ABS: 0 10*3/uL (ref 0.0–0.1)
BASOS PCT: 0 % (ref 0–1)
Eosinophils Absolute: 0.3 10*3/uL (ref 0.0–0.7)
Eosinophils Relative: 6 % — ABNORMAL HIGH (ref 0–5)
HCT: 40.6 % (ref 36.0–46.0)
Hemoglobin: 13.9 g/dL (ref 12.0–15.0)
Lymphocytes Relative: 47 % — ABNORMAL HIGH (ref 12–46)
Lymphs Abs: 2.1 10*3/uL (ref 0.7–4.0)
MCH: 29 pg (ref 26.0–34.0)
MCHC: 34.2 g/dL (ref 30.0–36.0)
MCV: 84.6 fL (ref 78.0–100.0)
Monocytes Absolute: 0.5 10*3/uL (ref 0.1–1.0)
Monocytes Relative: 10 % (ref 3–12)
NEUTROS ABS: 1.7 10*3/uL (ref 1.7–7.7)
Neutrophils Relative %: 37 % — ABNORMAL LOW (ref 43–77)
PLATELETS: 207 10*3/uL (ref 150–400)
RBC: 4.8 MIL/uL (ref 3.87–5.11)
RDW: 12.5 % (ref 11.5–15.5)
WBC: 4.6 10*3/uL (ref 4.0–10.5)

## 2013-12-30 LAB — COMPREHENSIVE METABOLIC PANEL
ALBUMIN: 4.5 g/dL (ref 3.5–5.2)
ALT: 16 U/L (ref 0–35)
AST: 18 U/L (ref 0–37)
Alkaline Phosphatase: 49 U/L (ref 39–117)
Anion gap: 15 (ref 5–15)
BILIRUBIN TOTAL: 0.3 mg/dL (ref 0.3–1.2)
BUN: 11 mg/dL (ref 6–23)
CHLORIDE: 104 meq/L (ref 96–112)
CO2: 22 meq/L (ref 19–32)
Calcium: 10.7 mg/dL — ABNORMAL HIGH (ref 8.4–10.5)
Creatinine, Ser: 0.8 mg/dL (ref 0.50–1.10)
GFR calc Af Amer: >90 mL/min (ref >90)
GFR calc non Af Amer: >90 mL/min (ref >90)
Glucose, Bld: 79 mg/dL (ref 70–99)
Potassium: 4.1 mEq/L (ref 3.7–5.3)
Sodium: 141 mEq/L (ref 137–147)
Total Protein: 8.7 g/dL — ABNORMAL HIGH (ref 6.0–8.3)

## 2013-12-30 LAB — LIPASE, BLOOD: Lipase: 35 U/L (ref 11–59)

## 2013-12-30 LAB — TROPONIN I: Troponin I: <0.3 ng/mL (ref <0.30)

## 2013-12-30 LAB — CALCIUM, IONIZED: CALCIUM ION: 1.34 mmol/L — AB (ref 1.12–1.23)

## 2013-12-30 LAB — D-DIMER, QUANTITATIVE (NOT AT ARMC): D-Dimer, Quant: <0.27 ug/mL-FEU (ref 0.00–0.48)

## 2013-12-30 MED ORDER — PANTOPRAZOLE SODIUM 20 MG PO TBEC: 20.0000 mg | DELAYED_RELEASE_TABLET | Freq: Every day | ORAL | Status: DC

## 2013-12-30 MED ORDER — GI COCKTAIL ~~LOC~~
30.0000 mL | Freq: Once | ORAL | Status: AC
  Administered 2013-12-30: 30 mL via ORAL
  Filled 2013-12-30: qty 30

## 2013-12-30 MED ORDER — SODIUM CHLORIDE 0.9 % IV SOLN
INTRAVENOUS | Status: DC
  Administered 2013-12-30: 10:00:00 via INTRAVENOUS
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
## 2013-12-30 NOTE — Discharge Instructions (Signed)

## 2013-12-30 NOTE — ED Notes (Signed)
MD at bedside. 
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
## 2013-12-30 NOTE — ED Notes (Signed)
Pt reports that pain is better after getting 325 mg of ASA en route with EMS. Pt appears anxious at this time.

## 2014-01-20 ENCOUNTER — Encounter: Payer: Self-pay | Admitting: Pulmonary Disease

## 2014-01-21 NOTE — Telephone Encounter (Signed)
Dr Gwenette Greet, please advise if okay to refill pt Adderall 10mg  Last filled 12/20/2013 Upcoming OV 02/21/14 Thanks!

## 2014-01-22 MED ORDER — AMPHETAMINE-DEXTROAMPHETAMINE 10 MG PO TABS
20.0000 mg | ORAL_TABLET | Freq: Every day | ORAL | Status: DC
Start: 2014-01-22 — End: 2014-02-21

## 2014-01-22 NOTE — Telephone Encounter (Signed)
If the pt is an established pt for sleep, and I have been refilling their prescription, you do not have to ask to keep filling.  Thanks.

## 2014-01-31 ENCOUNTER — Ambulatory Visit: Payer: PRIVATE HEALTH INSURANCE | Admitting: Pulmonary Disease

## 2014-02-21 ENCOUNTER — Ambulatory Visit (INDEPENDENT_AMBULATORY_CARE_PROVIDER_SITE_OTHER): Payer: PRIVATE HEALTH INSURANCE | Admitting: Pulmonary Disease

## 2014-02-21 ENCOUNTER — Encounter: Payer: Self-pay | Admitting: Pulmonary Disease

## 2014-02-21 VITALS — BP 100/70 | HR 79 | Temp 97.1°F | Ht 65.0 in | Wt 125.0 lb

## 2014-02-21 DIAGNOSIS — G47419 Narcolepsy without cataplexy: Secondary | ICD-10-CM

## 2014-02-21 MED ORDER — AMPHETAMINE-DEXTROAMPHETAMINE 10 MG PO TABS: ORAL_TABLET | ORAL | Status: DC

## 2014-02-21 NOTE — Progress Notes (Signed)
   Subjective:    Patient ID: Shirley Anderson, female    DOB: 09/24/1980, 33 y.o.   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 She has been started on a stimulant medication, and overall has done adequately. Most recently however, she has been having increased sleepiness in the afternoon that is interfering with her work efficiency and also giving her difficulty driving home. She then gets a second when, but then gets sleepy later in the evening. She is getting to bed at a reasonable hour, but does not feel refreshed upon awakening. She is denying him regarding hallucinations or sleep paralysis during the night.   Review of Systems  Constitutional: Negative for fever and unexpected weight change.  HENT: Negative for congestion, dental problem, ear pain, nosebleeds, postnasal drip, rhinorrhea, sinus pressure, sneezing, sore throat and trouble swallowing.   Eyes: Negative for redness and itching.  Respiratory: Negative for cough, chest tightness, shortness of breath and wheezing.   Cardiovascular: Negative for palpitations and leg swelling.  Gastrointestinal: Negative for nausea and vomiting.  Genitourinary: Negative for dysuria.  Musculoskeletal: Negative for joint swelling.  Skin: Negative for rash.  Neurological: Negative for headaches.  Hematological: Does not bruise/bleed easily.  Psychiatric/Behavioral: Negative for dysphoric mood. The patient is not nervous/anxious.        Objective:   Physical Exam Well-developed female in no acute distress Nose without purulence or discharge noted Lower extremities without edema, no cyanosis Alert, does not appear to be sleepy, moves all 4 extremities.       Assessment & Plan:

## 2014-02-21 NOTE — Patient Instructions (Addendum)
Will increase your adderall to 20mg  in the mornings, and 10mg  in the afternoon (1-3 pm) Make sure you are getting undisturbed sleep at night followup with again in 42mos, but call and give feedback in 3-4 weeks with new medication dosing.

## 2014-02-21 NOTE — Assessment & Plan Note (Signed)
The patient overall is doing okay, but she is seen increased sleepiness in the afternoon that can interfere with work and also make it difficult to drive home. I think we need to increase her stimulant medicine dose, and divide so that she is taking something in the afternoon.  She is also noting nonrestorative sleep despite getting to bed at a reasonable hour and having an adequate bedroom environment. It is unclear if this may be related to excessive REM, and we may try her on Effexor if she continues to have this issue. I have again stressed to her the importance of good sleep hygiene and modest caffeine and nap use during the day.

## 2014-03-04 ENCOUNTER — Encounter: Payer: Self-pay | Admitting: Internal Medicine

## 2014-03-04 ENCOUNTER — Ambulatory Visit (INDEPENDENT_AMBULATORY_CARE_PROVIDER_SITE_OTHER): Payer: PRIVATE HEALTH INSURANCE | Admitting: Internal Medicine

## 2014-03-04 VITALS — BP 102/70 | HR 69 | Ht 65.0 in | Wt 122.0 lb

## 2014-03-04 DIAGNOSIS — R55 Syncope and collapse: Secondary | ICD-10-CM | POA: Insufficient documentation
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
## 2014-03-04 NOTE — Assessment & Plan Note (Signed)
The etiology of the patient's syncope is unclear. There are multiple possible mechanisms. I do not think she has narcolepsy as the cause. With her history of orthostasis, autonomic dysfunction would seem most likely, however her episodes are not typical. It does not appear that a seizure is the cause. I have recommended insertion of an implantable loop recorder to try and help [place] sort out whether she has a bradycardia cardia or tachycardia etiology. She does carry a diagnosis of SVT, and I do question the validity of this diagnosis. Additional recommendations will be based on the results of her implantable loop recorder.

## 2014-03-04 NOTE — Patient Instructions (Signed)
Please check in at the [place] at 1:00pm on 03/14/14 for the [place]

## 2014-03-14 ENCOUNTER — Ambulatory Visit (HOSPITAL_COMMUNITY)
Admission: RE | Admit: 2014-03-14 | Discharge: 2014-03-14 | Disposition: A | Discharge status: Home / Self Care | Payer: PRIVATE HEALTH INSURANCE | Source: Ambulatory Visit | Attending: Internal Medicine | Admitting: Internal Medicine

## 2014-03-14 ENCOUNTER — Encounter (HOSPITAL_COMMUNITY)
Admission: RE | Disposition: A | Discharge status: Home / Self Care | Payer: Self-pay | Source: Ambulatory Visit | Attending: Internal Medicine

## 2014-03-14 DIAGNOSIS — C719 Malignant neoplasm of brain, unspecified: Secondary | ICD-10-CM | POA: Insufficient documentation

## 2014-03-14 DIAGNOSIS — I471 Supraventricular tachycardia: Secondary | ICD-10-CM | POA: Insufficient documentation

## 2014-03-14 DIAGNOSIS — E039 Hypothyroidism, unspecified: Secondary | ICD-10-CM | POA: Insufficient documentation

## 2014-03-14 DIAGNOSIS — I951 Orthostatic hypotension: Secondary | ICD-10-CM | POA: Insufficient documentation

## 2014-03-14 DIAGNOSIS — G47419 Narcolepsy without cataplexy: Secondary | ICD-10-CM | POA: Insufficient documentation

## 2014-03-14 DIAGNOSIS — R55 Syncope and collapse: Secondary | ICD-10-CM

## 2014-03-14 HISTORY — PX: LOOP RECORDER IMPLANT: SHX5477

## 2014-03-14 HISTORY — PX: LOOP RECORDER IMPLANT: SHX5954

## 2014-03-14 SURGERY — LOOP RECORDER IMPLANT
Anesthesia: LOCAL

## 2014-03-14 MED ORDER — ACETAMINOPHEN 325 MG PO TABS: 325.0000 mg | ORAL_TABLET | ORAL | Status: DC | PRN

## 2014-03-14 MED ORDER — ONDANSETRON HCL 4 MG/2ML IJ SOLN: 4.0000 mg | Freq: Four times a day (QID) | INTRAMUSCULAR | Status: DC | PRN

## 2014-03-14 MED ORDER — LIDOCAINE-EPINEPHRINE 1 %-1:100000 IJ SOLN
INTRAMUSCULAR | Status: AC
  Filled 2014-03-14: qty 1
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
## 2014-03-14 NOTE — CV Procedure (Signed)
EP Procedure Note  Procedure: Insertion of an ILR  Indication: unexplained syncope  Pre-procedure Diagnosis: unexplained syncope  Post-procedure Diagnosis: Same as preprocedure diagnosis  Description of the procedure: After informed consent was obtained, the patient was prepped and draped in a sterile fashion. 20 cc of lidocaine was infiltrated and a one cm stab incision made. The Medtronic ILR, serial [id number] was inserted into the subcutaneous space. R waves measured 1.0 mV. Benzoin and steristrips were painted on the skin and the patient recovered in the usual manner.   Complications: none immediately.  Conclusion: successful insertion of a Medtronic ILR in a patient with unexplained syncope.  Mikle Bosworth.D.

## 2014-03-14 NOTE — Interval H&P Note (Signed)
History and Physical Interval Note:  03/14/2014 10:40 AM  Shirley Anderson  has presented today for surgery, with the diagnosis of syncope  The various methods of treatment have been discussed with the patient and family. After consideration of risks, benefits and other options for treatment, the patient has consented to  Procedure(s): LOOP RECORDER IMPLANT (N/A) as a surgical intervention .  The patient's history has been reviewed, patient examined, no change in status, stable for surgery.  I have reviewed the patient's chart and labs.  Questions were answered to the patient's satisfaction.     Mikle Bosworth.D.

## 2014-03-16 ENCOUNTER — Encounter (HOSPITAL_COMMUNITY): Payer: Self-pay | Admitting: *Deleted

## 2014-03-20 ENCOUNTER — Encounter (HOSPITAL_COMMUNITY): Payer: Self-pay | Admitting: Internal Medicine

## 2014-03-20 ENCOUNTER — Encounter: Payer: Self-pay | Admitting: Internal Medicine

## 2014-03-25 ENCOUNTER — Encounter: Payer: Self-pay | Admitting: Pulmonary Disease

## 2014-03-25 ENCOUNTER — Other Ambulatory Visit: Payer: Self-pay | Admitting: Pulmonary Disease

## 2014-03-26 ENCOUNTER — Telehealth: Payer: Self-pay | Admitting: *Deleted

## 2014-03-26 MED ORDER — AMPHETAMINE-DEXTROAMPHETAMINE 10 MG PO TABS: ORAL_TABLET | ORAL | Status: DC

## 2014-03-26 MED ORDER — VENLAFAXINE HCL ER 37.5 MG PO CP24: 37.5000 mg | ORAL_CAPSULE | Freq: Every day | ORAL | Status: DC

## 2014-03-26 NOTE — Telephone Encounter (Signed)
Can try effexor XR 37.5 mg at bedtime to see if helps decrease quantity of dreaming associated with narcolepsy.  This may help her "hangover effect".

## 2014-03-26 NOTE — Telephone Encounter (Signed)
Called pt and is aware of recs. rx sent in

## 2014-03-26 NOTE — Telephone Encounter (Signed)
Pt email sent today:  Follow up RE: increase in Adderall.  Dr. Gwenette Greet asked that I follow up and let him know if the added tab in the afternoon helped with pm sleepiness. Please let Dr. Gwenette Greet know the added afternoon tab helps significantly.  However, I still have difficulty waking in the am. Dr. Gwenette Greet mentioned perhaps taking something at night to help with this. I have tried moving bedtime to an earlier hour with no relief. If Dr. Gwenette Greet feels adding new med at bedtime may be of benefit, I can be reached at [phone]. Thank you.  ---  Please advise [place] thanks

## 2014-03-27 ENCOUNTER — Ambulatory Visit (INDEPENDENT_AMBULATORY_CARE_PROVIDER_SITE_OTHER): Payer: PRIVATE HEALTH INSURANCE | Admitting: *Deleted

## 2014-03-27 DIAGNOSIS — R55 Syncope and collapse: Secondary | ICD-10-CM

## 2014-03-27 LAB — MDC_IDC_ENUM_SESS_TYPE_INCLINIC

## 2014-03-27 NOTE — Progress Notes (Signed)
Wound check ILR.  Steri strips removed by the patient.  Wound well healed.  No episodes noted.  R-waves 0.59-0.67mV.  ROV in March with Dr. Lovena Le.

## 2014-04-14 ENCOUNTER — Ambulatory Visit (INDEPENDENT_AMBULATORY_CARE_PROVIDER_SITE_OTHER): Payer: PRIVATE HEALTH INSURANCE | Admitting: *Deleted

## 2014-04-14 ENCOUNTER — Other Ambulatory Visit: Payer: Self-pay | Admitting: Internal Medicine

## 2014-04-14 DIAGNOSIS — R55 Syncope and collapse: Secondary | ICD-10-CM

## 2014-04-14 LAB — MDC_IDC_ENUM_SESS_TYPE_REMOTE: Date Time Interrogation Session: 20151231050500

## 2014-04-18 NOTE — Progress Notes (Signed)
Loop recorder 

## 2014-04-21 ENCOUNTER — Encounter: Payer: Self-pay | Admitting: Internal Medicine

## 2014-04-22 ENCOUNTER — Encounter: Payer: Self-pay | Admitting: Internal Medicine

## 2014-05-06 ENCOUNTER — Other Ambulatory Visit: Payer: Self-pay | Admitting: Pulmonary Disease

## 2014-05-06 MED ORDER — AMPHETAMINE-DEXTROAMPHETAMINE 10 MG PO TABS: ORAL_TABLET | ORAL | Status: DC

## 2014-05-13 ENCOUNTER — Encounter: Payer: Self-pay | Admitting: Internal Medicine

## 2014-05-15 NOTE — Telephone Encounter (Signed)
LMTCB//sss 

## 2014-05-16 ENCOUNTER — Telehealth: Payer: Self-pay | Admitting: Internal Medicine

## 2014-05-16 NOTE — Telephone Encounter (Signed)
New message     Patient is returning call, please call back

## 2014-05-16 NOTE — Telephone Encounter (Signed)
Informed patient that she can send in a manual transmission the morning prior to her procedure to make sure that Dr.Ganji's ofc has all the information. I encouraged her to call his ofc to see if he needed anything else. Verbal instructions were given on how to send a manual transmission. Patient voiced understanding.

## 2014-06-11 ENCOUNTER — Encounter: Payer: Self-pay | Admitting: Internal Medicine

## 2014-06-11 ENCOUNTER — Ambulatory Visit (INDEPENDENT_AMBULATORY_CARE_PROVIDER_SITE_OTHER): Payer: PRIVATE HEALTH INSURANCE | Admitting: Internal Medicine

## 2014-06-11 VITALS — BP 102/64 | HR 60 | Ht 65.0 in | Wt 122.4 lb

## 2014-06-11 DIAGNOSIS — R55 Syncope and collapse: Secondary | ICD-10-CM

## 2014-06-11 LAB — MDC_IDC_ENUM_SESS_TYPE_INCLINIC
MDC IDC SESS DTM: 20160302100343
MDC IDC SET ZONE DETECTION INTERVAL: 3000 ms
Zone Setting Detection Interval: 2000 ms
Zone Setting Detection Interval: 310 ms
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
## 2014-06-11 NOTE — Assessment & Plan Note (Signed)
She is s/p ILR. The etiology of her syncope is unclear but she may well have some orthostasis. She is encouraged to maintain adequate hydration and will undergo watchful waiting. Interogation of her ILR does not demonstrate any bradycardia.

## 2014-06-11 NOTE — Patient Instructions (Signed)
Your physician recommends that you continue on your current medications as directed. Please refer to the Current Medication list given to you today.  Your physician wants you to follow-up in: 6 months with Dr. Taylor.  You will receive a reminder letter in the mail two months in advance. If you don't receive a letter, please call our office to schedule the follow-up appointment.  

## 2014-06-15 ENCOUNTER — Telehealth: Payer: Self-pay | Admitting: Pulmonary Disease

## 2014-06-16 NOTE — Telephone Encounter (Signed)
[place] pt is requesting a refill on Adderall. Please advise. Thanks.

## 2014-06-17 MED ORDER — AMPHETAMINE-DEXTROAMPHETAMINE 10 MG PO TABS: ORAL_TABLET | ORAL | Status: DC

## 2014-06-17 NOTE — Telephone Encounter (Signed)
Rx printed and give to [facility] to sign with envelope attached to be placed up front to pick up.  I have e-mailed patient back to make aware that rx is ready for pick up.

## 2014-06-17 NOTE — Telephone Encounter (Signed)
Come on guys, we fill this every single month! Please see chart.  Do not have to send a message everytime

## 2014-07-17 ENCOUNTER — Encounter: Payer: Self-pay | Admitting: Internal Medicine

## 2014-07-28 ENCOUNTER — Other Ambulatory Visit: Payer: Self-pay | Admitting: Pulmonary Disease

## 2014-07-29 MED ORDER — AMPHETAMINE-DEXTROAMPHETAMINE 10 MG PO TABS: ORAL_TABLET | ORAL | Status: DC

## 2014-07-29 NOTE — Telephone Encounter (Signed)
Rx printed and placed on Shirley Anderson's desk to be signed  Please advise when signed so we can email pt and let her know thanks

## 2014-07-29 NOTE — Addendum Note (Signed)
Addended by: Rosana Berger on: 07/29/2014 02:20 PM   Modules accepted: Orders

## 2014-08-29 ENCOUNTER — Ambulatory Visit (INDEPENDENT_AMBULATORY_CARE_PROVIDER_SITE_OTHER): Payer: PRIVATE HEALTH INSURANCE | Admitting: Pulmonary Disease

## 2014-08-29 ENCOUNTER — Encounter: Payer: Self-pay | Admitting: Pulmonary Disease

## 2014-08-29 VITALS — BP 112/64 | HR 75 | Temp 98.3°F | Ht 65.0 in | Wt 125.2 lb

## 2014-08-29 DIAGNOSIS — G47419 Narcolepsy without cataplexy: Secondary | ICD-10-CM

## 2014-08-29 NOTE — Patient Instructions (Signed)
Continue on adderall as you are doing, and make sure you are getting adequate sleep at night. followup with Dr. Halford Chessman in one year since you are doing well, but let us know if you wish your neurologist at [facility] to follow your narcolepsy.

## 2014-08-29 NOTE — Progress Notes (Signed)
   Subjective:    Patient ID: Shirley Anderson, female    DOB: 1981/01/06, 34 y.o.   MRN: [id number]  HPI The patient comes in today for follow-up of her known narcolepsy. Currently on Adderall for her symptoms during the day, and has done well with this. She is trying to get adequate sleep at night, and will drink coffee periodically if she has significant sleepiness. She is currently satisfied with her daytime alertness.   Review of Systems  Constitutional: Negative for fever and unexpected weight change.  HENT: Negative for congestion, dental problem, ear pain, nosebleeds, postnasal drip, rhinorrhea, sinus pressure, sneezing, sore throat and trouble swallowing.   Eyes: Negative for redness and itching.  Respiratory: Negative for cough, chest tightness, shortness of breath and wheezing.   Cardiovascular: Negative for palpitations and leg swelling.  Gastrointestinal: Negative for nausea and vomiting.  Genitourinary: Negative for dysuria.  Musculoskeletal: Negative for joint swelling.  Skin: Negative for rash.  Neurological: Negative for headaches.  Hematological: Does not bruise/bleed easily.  Psychiatric/Behavioral: Negative for dysphoric mood. The patient is not nervous/anxious.        Objective:   Physical Exam Well-developed female in no acute distress Nose without purulence or discharge noted Neck without lymphadenopathy or thyromegaly Lower extremities without edema, no cyanosis Alert and oriented, moves all 4 extremities.       Assessment & Plan:

## 2014-08-29 NOTE — Assessment & Plan Note (Signed)
The patient has done very well since she has been changed over to Adderall. She tries to limit her dose to the morning as much as she can, but occasionally will need a dose in the afternoon. She is maintaining good sleep hygiene, and overall is satisfied with her alertness during the day. I've asked her to continue on her current meds, and to follow-up again in one year.

## 2014-09-10 ENCOUNTER — Other Ambulatory Visit: Payer: Self-pay | Admitting: Pulmonary Disease

## 2014-09-11 MED ORDER — AMPHETAMINE-DEXTROAMPHETAMINE 10 MG PO TABS: ORAL_TABLET | ORAL | Status: DC

## 2014-09-11 NOTE — Telephone Encounter (Signed)
Discussed with Ria Comment - she has sent email to pt asking if she would like to pick up rx or have it mailed to her Per Ria Comment, rx is up front in brown accordion folder

## 2014-12-26 ENCOUNTER — Encounter: Payer: Self-pay | Admitting: Internal Medicine

## 2014-12-26 ENCOUNTER — Ambulatory Visit (INDEPENDENT_AMBULATORY_CARE_PROVIDER_SITE_OTHER): Payer: PRIVATE HEALTH INSURANCE | Admitting: Internal Medicine

## 2014-12-26 VITALS — BP 100/62 | HR 103 | Ht 65.0 in | Wt 126.8 lb

## 2014-12-26 DIAGNOSIS — Z4509 Encounter for adjustment and management of other cardiac device: Secondary | ICD-10-CM

## 2014-12-26 DIAGNOSIS — R55 Syncope and collapse: Secondary | ICD-10-CM

## 2014-12-26 DIAGNOSIS — G47419 Narcolepsy without cataplexy: Secondary | ICD-10-CM | POA: Diagnosis not present

## 2014-12-26 LAB — CUP PACEART INCLINIC DEVICE CHECK
Date Time Interrogation Session: 20160916151650
MDC IDC SET ZONE DETECTION INTERVAL: 3000 ms
Zone Setting Detection Interval: 2000 ms
Zone Setting Detection Interval: 310 ms

## 2014-12-26 NOTE — Assessment & Plan Note (Signed)
The etiology is unclear. She will return in a year for ILR followup and evaluation.

## 2014-12-26 NOTE — Assessment & Plan Note (Signed)
She remains on Adderall. No change in meds. She appears to be well controlled.

## 2014-12-26 NOTE — Patient Instructions (Signed)
Medication Instructions:  Your physician recommends that you continue on your current medications as directed. Please refer to the Current Medication list given to you today.  Labwork: None ordered  Testing/Procedures: None ordered  Follow-Up: Your physician wants you to follow-up in: 1 year with Dr. Taylor. You will receive a reminder letter in the mail two months in advance. If you don't receive a letter, please call our office to schedule the follow-up appointment.  Any Other Special Instructions Will Be Listed Below (If Applicable). Thank you for choosing [facility]!!       
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
## 2015-01-11 ENCOUNTER — Encounter (HOSPITAL_BASED_OUTPATIENT_CLINIC_OR_DEPARTMENT_OTHER): Payer: Self-pay | Admitting: Emergency Medicine

## 2015-01-11 ENCOUNTER — Emergency Department (HOSPITAL_BASED_OUTPATIENT_CLINIC_OR_DEPARTMENT_OTHER): Payer: PRIVATE HEALTH INSURANCE

## 2015-01-11 ENCOUNTER — Emergency Department (HOSPITAL_BASED_OUTPATIENT_CLINIC_OR_DEPARTMENT_OTHER)
Admission: EM | Admit: 2015-01-11 | Discharge: 2015-01-11 | Disposition: A | Discharge status: Home / Self Care | Payer: PRIVATE HEALTH INSURANCE | Attending: Physician Assistant | Admitting: Physician Assistant

## 2015-01-11 DIAGNOSIS — Z86011 Personal history of benign neoplasm of the brain: Secondary | ICD-10-CM | POA: Insufficient documentation

## 2015-01-11 DIAGNOSIS — Z8679 Personal history of other diseases of the circulatory system: Secondary | ICD-10-CM | POA: Diagnosis not present

## 2015-01-11 DIAGNOSIS — Z3202 Encounter for pregnancy test, result negative: Secondary | ICD-10-CM | POA: Insufficient documentation

## 2015-01-11 DIAGNOSIS — R1032 Left lower quadrant pain: Secondary | ICD-10-CM | POA: Diagnosis present

## 2015-01-11 DIAGNOSIS — K279 Peptic ulcer, site unspecified, unspecified as acute or chronic, without hemorrhage or perforation: Secondary | ICD-10-CM | POA: Insufficient documentation

## 2015-01-11 DIAGNOSIS — E039 Hypothyroidism, unspecified: Secondary | ICD-10-CM | POA: Insufficient documentation

## 2015-01-11 DIAGNOSIS — Z79899 Other long term (current) drug therapy: Secondary | ICD-10-CM | POA: Insufficient documentation

## 2015-01-11 LAB — COMPREHENSIVE METABOLIC PANEL
ALT: 17 U/L (ref 14–54)
AST: 19 U/L (ref 15–41)
Albumin: 4.9 g/dL (ref 3.5–5.0)
Alkaline Phosphatase: 51 U/L (ref 38–126)
Anion gap: 10 (ref 5–15)
BILIRUBIN TOTAL: 0.6 mg/dL (ref 0.3–1.2)
BUN: 7 mg/dL (ref 6–20)
CHLORIDE: 107 mmol/L (ref 101–111)
CO2: 21 mmol/L — ABNORMAL LOW (ref 22–32)
Calcium: 10 mg/dL (ref 8.9–10.3)
Creatinine, Ser: 0.81 mg/dL (ref 0.44–1.00)
Glucose, Bld: 106 mg/dL — ABNORMAL HIGH (ref 65–99)
POTASSIUM: 3.7 mmol/L (ref 3.5–5.1)
Sodium: 138 mmol/L (ref 135–145)
TOTAL PROTEIN: 8.4 g/dL — AB (ref 6.5–8.1)

## 2015-01-11 LAB — CBC WITH DIFFERENTIAL/PLATELET
BASOS ABS: 0 10*3/uL (ref 0.0–0.1)
BASOS PCT: 0 %
EOS PCT: 3 %
Eosinophils Absolute: 0.2 10*3/uL (ref 0.0–0.7)
HEMATOCRIT: 42.6 % (ref 36.0–46.0)
Hemoglobin: 14.6 g/dL (ref 12.0–15.0)
Lymphocytes Relative: 38 %
Lymphs Abs: 2.3 10*3/uL (ref 0.7–4.0)
MCH: 28.6 pg (ref 26.0–34.0)
MCHC: 34.3 g/dL (ref 30.0–36.0)
MCV: 83.5 fL (ref 78.0–100.0)
MONO ABS: 0.5 10*3/uL (ref 0.1–1.0)
MONOS PCT: 8 %
NEUTROS ABS: 3 10*3/uL (ref 1.7–7.7)
Neutrophils Relative %: 51 %
PLATELETS: 219 10*3/uL (ref 150–400)
RBC: 5.1 MIL/uL (ref 3.87–5.11)
RDW: 13.4 % (ref 11.5–15.5)
WBC: 5.9 10*3/uL (ref 4.0–10.5)

## 2015-01-11 LAB — URINALYSIS, ROUTINE W REFLEX MICROSCOPIC
Bilirubin Urine: NEGATIVE
Glucose, UA: NEGATIVE mg/dL
HGB URINE DIPSTICK: NEGATIVE
Ketones, ur: NEGATIVE mg/dL
Nitrite: NEGATIVE
Protein, ur: NEGATIVE mg/dL
SPECIFIC GRAVITY, URINE: 1.001 — AB (ref 1.005–1.030)
UROBILINOGEN UA: 0.2 mg/dL (ref 0.0–1.0)
pH: 7 (ref 5.0–8.0)

## 2015-01-11 LAB — URINE MICROSCOPIC-ADD ON

## 2015-01-11 LAB — LIPASE, BLOOD: Lipase: 32 U/L (ref 22–51)

## 2015-01-11 LAB — PREGNANCY, URINE: PREG TEST UR: NEGATIVE

## 2015-01-11 MED ORDER — ALUM & MAG HYDROXIDE-SIMETH 200-200-20 MG/5ML PO SUSP
30.0000 mL | Freq: Once | ORAL | Status: AC
  Administered 2015-01-11: 30 mL via ORAL
  Filled 2015-01-11: qty 30

## 2015-01-11 MED ORDER — IOHEXOL 300 MG/ML  SOLN
25.0000 mL | Freq: Once | INTRAMUSCULAR | Status: AC | PRN
  Administered 2015-01-11: 25 mL via ORAL

## 2015-01-11 MED ORDER — SODIUM CHLORIDE 0.9 % IV SOLN
Freq: Once | INTRAVENOUS | Status: AC
  Administered 2015-01-11: 1 mL via INTRAVENOUS

## 2015-01-11 MED ORDER — IOHEXOL 300 MG/ML  SOLN
100.0000 mL | Freq: Once | INTRAMUSCULAR | Status: AC | PRN
  Administered 2015-01-11: 100 mL via INTRAVENOUS

## 2015-01-11 MED ORDER — OXYCODONE-ACETAMINOPHEN 5-325 MG PO TABS: 1.0000 | ORAL_TABLET | Freq: Four times a day (QID) | ORAL | Status: DC | PRN

## 2015-01-11 MED ORDER — SODIUM CHLORIDE 0.9 % IV BOLUS (SEPSIS)
1000.0000 mL | Freq: Once | INTRAVENOUS | Status: AC
  Administered 2015-01-11: 1000 mL via INTRAVENOUS

## 2015-01-11 NOTE — ED Notes (Addendum)
Pt in c/o LLQ pain x 1 week starting after she finished her menstrual cycle, states pain is ULQ immediately after eating. Pt appears in pain and is tachycardic at 125 bpm.
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
## 2015-01-11 NOTE — ED Notes (Signed)
Patient transported to CT 

## 2015-01-11 NOTE — Discharge Instructions (Signed)
Follow-up with her regular physician regarding the nodule in your lung. Continue to take omeprazole. Use Pepto-Bismol or Maalox to help with her symptoms. Follow-up with a GI physician if the pain continues. Use Percocet as needed to help with pain from your ruptured cyst. Peptic Ulcer A peptic ulcer is a sore in the lining of your esophagus (esophageal ulcer), stomach (gastric ulcer), or in the first part of your small intestine (duodenal ulcer). The ulcer causes erosion into the deeper tissue. CAUSES  Normally, the lining of the stomach and the small intestine protects itself from the acid that digests food. The protective lining can be damaged by:  An infection caused by a bacterium called Helicobacter pylori (H. pylori).  Regular use of nonsteroidal anti-inflammatory drugs (NSAIDs), such as ibuprofen or aspirin.  Smoking tobacco. Other risk factors include being older than 63, drinking alcohol excessively, and having a family history of ulcer disease.  SYMPTOMS   Burning pain or gnawing in the area between the chest and the belly button.  Heartburn.  Nausea and vomiting.  Bloating. The pain can be worse on an empty stomach and at night. If the ulcer results in bleeding, it can cause:  Black, tarry stools.  Vomiting of bright red blood.  Vomiting of coffee-ground-looking materials. DIAGNOSIS  A diagnosis is usually made based upon your history and an exam. Other tests and procedures may be performed to find the cause of the ulcer. Finding a cause will help determine the best treatment. Tests and procedures may include:  Blood tests, stool tests, or breath tests to check for the bacterium H. pylori.  An upper gastrointestinal (GI) series of the esophagus, stomach, and small intestine.  An endoscopy to examine the esophagus, stomach, and small intestine.  A biopsy. TREATMENT  Treatment may include:  Eliminating the cause of the ulcer, such as smoking, NSAIDs, or  alcohol.  Medicines to reduce the amount of acid in your digestive tract.  Antibiotic medicines if the ulcer is caused by the H. pylori bacterium.  An upper endoscopy to treat a bleeding ulcer.  Surgery if the bleeding is severe or if the ulcer created a hole somewhere in the digestive system. HOME CARE INSTRUCTIONS   Avoid tobacco, alcohol, and caffeine. Smoking can increase the acid in the stomach, and continued smoking will impair the healing of ulcers.  Avoid foods and drinks that seem to cause discomfort or aggravate your ulcer.  Only take medicines as directed by your caregiver. Do not substitute over-the-counter medicines for prescription medicines without talking to your caregiver.  Keep any follow-up appointments and tests as directed. SEEK MEDICAL CARE IF:   Your do not improve within 7 days of starting treatment.  You have ongoing indigestion or heartburn. SEEK IMMEDIATE MEDICAL CARE IF:   You have sudden, sharp, or persistent abdominal pain.  You have bloody or dark black, tarry stools.  You vomit blood or vomit that looks like coffee grounds.  You become light-headed, weak, or feel faint.  You become sweaty or clammy. MAKE SURE YOU:   Understand these instructions.  Will watch your condition.  Will get help right away if you are not doing well or get worse. Document Released: 03/25/2000 Document Revised: 08/12/2013 Document Reviewed: 10/26/2011 [facility] Patient Information 2015 [place], [place]. This information is not intended to replace advice given to you by your health care provider. Make sure you discuss any questions you have with your health care provider.   Food Choices for Peptic Ulcer Disease When  you have peptic ulcer disease, the foods you eat and your eating habits are very important. Choosing the right foods can help ease the discomfort of peptic ulcer disease. WHAT GENERAL GUIDELINES DO I NEED TO FOLLOW?  Choose fruits, vegetables, whole  grains, and low-fat meat, fish, and poultry.   Keep a food diary to identify foods that cause symptoms.  Avoid foods that cause irritation or pain. These may be different for different people.  Eat frequent small meals instead of three large meals each day. The pain may be worse when your stomach is empty.  Avoid eating close to bedtime. WHAT FOODS ARE NOT RECOMMENDED? The following are some foods and drinks that may worsen your symptoms:  Black, white, and red pepper.  Hot sauce.  Chili peppers.  Chili powder.  Chocolate and cocoa.   Alcohol.  Tea, coffee, and cola (regular and decaffeinated). The items listed above may not be a complete list of foods and beverages to avoid. Contact your dietitian for more information. Document Released: 06/20/2011 Document Revised: 04/02/2013 Document Reviewed: 01/30/2013 [facility] Patient Information 2015 [place], [place]. This information is not intended to replace advice given to you by your health care provider. Make sure you discuss any questions you have with your health care provider.

## 2015-08-06 ENCOUNTER — Encounter (HOSPITAL_BASED_OUTPATIENT_CLINIC_OR_DEPARTMENT_OTHER): Payer: Self-pay | Admitting: *Deleted

## 2015-08-06 ENCOUNTER — Emergency Department (HOSPITAL_BASED_OUTPATIENT_CLINIC_OR_DEPARTMENT_OTHER): Payer: No Typology Code available for payment source

## 2015-08-06 ENCOUNTER — Emergency Department (HOSPITAL_BASED_OUTPATIENT_CLINIC_OR_DEPARTMENT_OTHER)
Admission: EM | Admit: 2015-08-06 | Discharge: 2015-08-06 | Disposition: A | Discharge status: Home / Self Care | Payer: No Typology Code available for payment source | Attending: Emergency Medicine | Admitting: Emergency Medicine

## 2015-08-06 DIAGNOSIS — Z85841 Personal history of malignant neoplasm of brain: Secondary | ICD-10-CM | POA: Insufficient documentation

## 2015-08-06 DIAGNOSIS — I471 Supraventricular tachycardia: Secondary | ICD-10-CM | POA: Insufficient documentation

## 2015-08-06 DIAGNOSIS — R42 Dizziness and giddiness: Secondary | ICD-10-CM | POA: Insufficient documentation

## 2015-08-06 DIAGNOSIS — E039 Hypothyroidism, unspecified: Secondary | ICD-10-CM | POA: Diagnosis not present

## 2015-08-06 LAB — CBC WITH DIFFERENTIAL/PLATELET
BASOS ABS: 0 10*3/uL (ref 0.0–0.1)
Basophils Relative: 0 %
Eosinophils Absolute: 0.2 10*3/uL (ref 0.0–0.7)
Eosinophils Relative: 2 %
HEMATOCRIT: 40.6 % (ref 36.0–46.0)
Hemoglobin: 14 g/dL (ref 12.0–15.0)
LYMPHS PCT: 32 %
Lymphs Abs: 2.1 10*3/uL (ref 0.7–4.0)
MCH: 29.2 pg (ref 26.0–34.0)
MCHC: 34.5 g/dL (ref 30.0–36.0)
MCV: 84.6 fL (ref 78.0–100.0)
Monocytes Absolute: 0.5 10*3/uL (ref 0.1–1.0)
Monocytes Relative: 7 %
NEUTROS ABS: 3.8 10*3/uL (ref 1.7–7.7)
NEUTROS PCT: 58 %
PLATELETS: 215 10*3/uL (ref 150–400)
RBC: 4.8 MIL/uL (ref 3.87–5.11)
RDW: 13.3 % (ref 11.5–15.5)
WBC: 6.5 10*3/uL (ref 4.0–10.5)

## 2015-08-06 LAB — COMPREHENSIVE METABOLIC PANEL
ALK PHOS: 49 U/L (ref 38–126)
ALT: 25 U/L (ref 14–54)
ANION GAP: 7 (ref 5–15)
AST: 26 U/L (ref 15–41)
Albumin: 4.8 g/dL (ref 3.5–5.0)
BUN: 9 mg/dL (ref 6–20)
CO2: 21 mmol/L — ABNORMAL LOW (ref 22–32)
Calcium: 9.4 mg/dL (ref 8.9–10.3)
Chloride: 106 mmol/L (ref 101–111)
Creatinine, Ser: 0.61 mg/dL (ref 0.44–1.00)
GFR calc Af Amer: >60 mL/min (ref >60)
GFR calc non Af Amer: >60 mL/min (ref >60)
Glucose, Bld: 92 mg/dL (ref 65–99)
POTASSIUM: 3.9 mmol/L (ref 3.5–5.1)
SODIUM: 134 mmol/L — AB (ref 135–145)
TOTAL PROTEIN: 8.3 g/dL — AB (ref 6.5–8.1)
Total Bilirubin: 0.6 mg/dL (ref 0.3–1.2)

## 2015-08-06 LAB — PREGNANCY, URINE: Preg Test, Ur: NEGATIVE

## 2015-08-06 LAB — URINALYSIS, ROUTINE W REFLEX MICROSCOPIC
Bilirubin Urine: NEGATIVE
Glucose, UA: NEGATIVE mg/dL
Hgb urine dipstick: NEGATIVE
KETONES UR: 15 mg/dL — AB
LEUKOCYTES UA: NEGATIVE
NITRITE: NEGATIVE
PH: 5.5 (ref 5.0–8.0)
Protein, ur: NEGATIVE mg/dL
Specific Gravity, Urine: 1.002 — ABNORMAL LOW (ref 1.005–1.030)

## 2015-08-06 NOTE — ED Notes (Signed)
Presents with feeling weak, tired and light headiness, onset approx 1 week ago, has become progressively worse.

## 2015-08-06 NOTE — Discharge Instructions (Signed)
There does not appear to be an emergent cause for your symptoms at this time. Your exam, labs, EKG and chest x-ray are all reassuring. Please follow-up with your doctor for possible outpatient MRI. Return to ED for any new or worsening symptoms as we discussed.

## 2015-08-06 NOTE — ED Notes (Signed)
Pt c/o dizziness HX brain tumor, sent here from [place] office for eval.
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
## 2016-04-22 ENCOUNTER — Ambulatory Visit (INDEPENDENT_AMBULATORY_CARE_PROVIDER_SITE_OTHER): Payer: BLUE CROSS/BLUE SHIELD | Admitting: Internal Medicine

## 2016-04-22 ENCOUNTER — Encounter: Payer: Self-pay | Admitting: Internal Medicine

## 2016-04-22 VITALS — BP 122/80 | HR 83 | Ht 65.0 in | Wt 125.6 lb

## 2016-04-22 DIAGNOSIS — R55 Syncope and collapse: Secondary | ICD-10-CM

## 2016-04-22 LAB — CUP PACEART INCLINIC DEVICE CHECK
MDC IDC PG IMPLANT DT: 20151204
MDC IDC SESS DTM: 20180112151245

## 2016-04-22 NOTE — Patient Instructions (Addendum)
Medication Instructions:    Your physician recommends that you continue on your current medications as directed. Please refer to the Current Medication list given to you today.  - If you need a refill on your cardiac medications before your next appointment, please call your pharmacy.   Labwork:  None ordered  Testing/Procedures:  None ordered  Follow-Up:  Your physician wants you to follow-up in: 1 year with Dr. Taylor.  You will receive a reminder letter in the mail two months in advance. If you don't receive a letter, please call our office to schedule the follow-up appointment.  Thank you for choosing [facility]!!          
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
## 2016-07-18 ENCOUNTER — Other Ambulatory Visit: Payer: Self-pay | Admitting: Cardiology

## 2016-07-18 DIAGNOSIS — I872 Venous insufficiency (chronic) (peripheral): Secondary | ICD-10-CM

## 2016-07-18 DIAGNOSIS — I878 Other specified disorders of veins: Secondary | ICD-10-CM

## 2016-07-27 ENCOUNTER — Ambulatory Visit
Admission: RE | Admit: 2016-07-27 | Discharge: 2016-07-27 | Disposition: A | Discharge status: Home / Self Care | Payer: BLUE CROSS/BLUE SHIELD | Source: Ambulatory Visit | Attending: Cardiology | Admitting: Cardiology

## 2016-07-27 DIAGNOSIS — I872 Venous insufficiency (chronic) (peripheral): Secondary | ICD-10-CM

## 2016-07-27 DIAGNOSIS — I878 Other specified disorders of veins: Secondary | ICD-10-CM
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
## 2016-11-01 ENCOUNTER — Other Ambulatory Visit (HOSPITAL_COMMUNITY): Payer: Self-pay | Admitting: Interventional Radiology

## 2016-11-01 DIAGNOSIS — I872 Venous insufficiency (chronic) (peripheral): Secondary | ICD-10-CM

## 2016-12-14 ENCOUNTER — Telehealth: Payer: Self-pay | Admitting: Vascular Surgery

## 2016-12-14 NOTE — Telephone Encounter (Signed)
Scheduled new pt appt w/ pt for 01/31/17 at 1pm w/ Dr.Lawson. I mailed new patient pw to the patient as well. awt

## 2016-12-14 NOTE — Telephone Encounter (Signed)
-----   Message from Rica Records, RN sent at 12/14/2016 12:01 PM EDT ----- Regarding: scheduling Contact: [phone] Please call Shirley Anderson at above number to schedule new VV appointment with Dr. Kellie Simmering. She is a self-referral with no HX of vein treatment.  She has been seen by a radiologist and has current ultrasound.  I asked her to bring copies of all doctor's notes, ultrasound, and notes/prescriptions regarding compression hose with her to appt. with J[facility]. She is having left leg pain and swelling and left leg varicosities for 1 year.

## 2017-01-26 ENCOUNTER — Encounter (HOSPITAL_COMMUNITY): Payer: Self-pay

## 2017-01-26 ENCOUNTER — Emergency Department (HOSPITAL_COMMUNITY)
Admission: EM | Admit: 2017-01-26 | Discharge: 2017-01-26 | Disposition: A | Discharge status: Home / Self Care | Payer: BLUE CROSS/BLUE SHIELD | Attending: Emergency Medicine | Admitting: Emergency Medicine

## 2017-01-26 ENCOUNTER — Emergency Department (HOSPITAL_COMMUNITY): Payer: BLUE CROSS/BLUE SHIELD

## 2017-01-26 DIAGNOSIS — R569 Unspecified convulsions: Secondary | ICD-10-CM | POA: Insufficient documentation

## 2017-01-26 DIAGNOSIS — R200 Anesthesia of skin: Secondary | ICD-10-CM | POA: Insufficient documentation

## 2017-01-26 DIAGNOSIS — Z79899 Other long term (current) drug therapy: Secondary | ICD-10-CM | POA: Insufficient documentation

## 2017-01-26 DIAGNOSIS — E039 Hypothyroidism, unspecified: Secondary | ICD-10-CM | POA: Diagnosis not present

## 2017-01-26 LAB — CBC WITH DIFFERENTIAL/PLATELET
Basophils Absolute: 0.1 10*3/uL (ref 0.0–0.1)
Basophils Relative: 2 %
EOS ABS: 0.2 10*3/uL (ref 0.0–0.7)
EOS PCT: 4 %
HCT: 38 % (ref 36.0–46.0)
Hemoglobin: 12.6 g/dL (ref 12.0–15.0)
Lymphocytes Relative: 36 %
Lymphs Abs: 1.5 10*3/uL (ref 0.7–4.0)
MCH: 27.4 pg (ref 26.0–34.0)
MCHC: 33.2 g/dL (ref 30.0–36.0)
MCV: 82.6 fL (ref 78.0–100.0)
MONO ABS: 0.3 10*3/uL (ref 0.1–1.0)
Monocytes Relative: 8 %
Neutro Abs: 2 10*3/uL (ref 1.7–7.7)
Neutrophils Relative %: 50 %
PLATELETS: 193 10*3/uL (ref 150–400)
RBC: 4.6 MIL/uL (ref 3.87–5.11)
RDW: 13.3 % (ref 11.5–15.5)
WBC: 4 10*3/uL (ref 4.0–10.5)

## 2017-01-26 LAB — BASIC METABOLIC PANEL
Anion gap: 11 (ref 5–15)
BUN: 7 mg/dL (ref 6–20)
CALCIUM: 9.5 mg/dL (ref 8.9–10.3)
CO2: 19 mmol/L — ABNORMAL LOW (ref 22–32)
CREATININE: 0.8 mg/dL (ref 0.44–1.00)
Chloride: 108 mmol/L (ref 101–111)
GFR calc Af Amer: >60 mL/min (ref >60)
Glucose, Bld: 96 mg/dL (ref 65–99)
POTASSIUM: 3.7 mmol/L (ref 3.5–5.1)
SODIUM: 138 mmol/L (ref 135–145)

## 2017-01-26 LAB — TSH: TSH: 1.037 u[IU]/mL (ref 0.350–4.500)

## 2017-01-26 MED ORDER — SODIUM CHLORIDE 0.9 % IV BOLUS (SEPSIS)
1000.0000 mL | Freq: Once | INTRAVENOUS | Status: AC
  Administered 2017-01-26: 1000 mL via INTRAVENOUS

## 2017-01-26 MED ORDER — LORAZEPAM 2 MG/ML IJ SOLN
1.0000 mg | Freq: Once | INTRAMUSCULAR | Status: AC
  Administered 2017-01-26: 1 mg via INTRAVENOUS

## 2017-01-26 MED ORDER — LORAZEPAM 2 MG/ML IJ SOLN
INTRAMUSCULAR | Status: AC
  Administered 2017-01-26: 1 mg via INTRAVENOUS
  Filled 2017-01-26: qty 1

## 2017-01-26 NOTE — ED Notes (Signed)
Nursing rounds completed. Husband at the bedside. Pt is resting comfortably. No complaints at this time. Waiting on md re eval following ct results. Pt is alert and oriented.

## 2017-01-26 NOTE — ED Notes (Signed)
Patient transported to CT 
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
## 2017-01-26 NOTE — ED Notes (Signed)
Dr. Cook at the bedside.  

## 2017-01-26 NOTE — ED Notes (Signed)
Patient ambulated to the bathroom with minimal assist. No complaints. Waiting on re eval.

## 2017-01-26 NOTE — ED Notes (Signed)
Nursing called to room during md eval. Patient was having another seizure. Pt was tonic with arms bent and a left sided gaze, unresponsive to voice but airway remained intact. Seizure lasted approx 1 minute. Verbal order for ativan given and meds administered. Seizure activity has not ceased and patient is post ictal and is slowly responsive to voice and commands.

## 2017-01-26 NOTE — Discharge Instructions (Signed)
CT head and CT cervical spine were negative.  You will be sore for several days. Rest. Increase fluids.  Follow up your primary care dr
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
## 2017-01-31 ENCOUNTER — Ambulatory Visit (INDEPENDENT_AMBULATORY_CARE_PROVIDER_SITE_OTHER): Payer: BLUE CROSS/BLUE SHIELD | Admitting: Vascular Surgery

## 2017-01-31 ENCOUNTER — Encounter: Payer: Self-pay | Admitting: Vascular Surgery

## 2017-01-31 VITALS — BP 115/80 | HR 83 | Temp 98.4°F | Resp 16 | Ht 66.0 in | Wt 128.5 lb

## 2017-01-31 DIAGNOSIS — I83892 Varicose veins of left lower extremities with other complications: Secondary | ICD-10-CM | POA: Diagnosis not present
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
## 2017-01-31 NOTE — Progress Notes (Signed)
Subjective:     Patient ID: Shirley Anderson, female   DOB: 07-01-1980, 36 y.o.   MRN: [id number]  HPI   Review of Systems     Objective:   Physical Exam     Assessment:         Plan:
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
## 2023-02-08 ENCOUNTER — Other Ambulatory Visit: Payer: Self-pay

## 2023-02-08 ENCOUNTER — Encounter (HOSPITAL_COMMUNITY): Payer: Self-pay

## 2023-02-08 ENCOUNTER — Emergency Department (HOSPITAL_COMMUNITY)
Admission: EM | Admit: 2023-02-08 | Discharge: 2023-02-08 | Disposition: A | Discharge status: Home / Self Care | Payer: BC Managed Care – PPO

## 2023-02-08 ENCOUNTER — Emergency Department (HOSPITAL_COMMUNITY): Payer: BC Managed Care – PPO

## 2023-02-08 DIAGNOSIS — G40909 Epilepsy, unspecified, not intractable, without status epilepticus: Secondary | ICD-10-CM | POA: Insufficient documentation

## 2023-02-08 DIAGNOSIS — E039 Hypothyroidism, unspecified: Secondary | ICD-10-CM | POA: Diagnosis not present

## 2023-02-08 DIAGNOSIS — R519 Headache, unspecified: Secondary | ICD-10-CM | POA: Diagnosis not present

## 2023-02-08 DIAGNOSIS — Z79899 Other long term (current) drug therapy: Secondary | ICD-10-CM | POA: Diagnosis not present

## 2023-02-08 DIAGNOSIS — R569 Unspecified convulsions: Secondary | ICD-10-CM

## 2023-02-08 LAB — COMPREHENSIVE METABOLIC PANEL
ALT: 14 U/L (ref 0–44)
AST: 20 U/L (ref 15–41)
Albumin: 4 g/dL (ref 3.5–5.0)
Alkaline Phosphatase: 46 U/L (ref 38–126)
Anion gap: 13 (ref 5–15)
BUN: 11 mg/dL (ref 6–20)
CO2: 21 mmol/L — ABNORMAL LOW (ref 22–32)
Calcium: 9.6 mg/dL (ref 8.9–10.3)
Chloride: 104 mmol/L (ref 98–111)
Creatinine, Ser: 0.8 mg/dL (ref 0.44–1.00)
GFR, Estimated: >60 mL/min (ref >60)
Glucose, Bld: 101 mg/dL — ABNORMAL HIGH (ref 70–99)
Potassium: 3.6 mmol/L (ref 3.5–5.1)
Sodium: 138 mmol/L (ref 135–145)
Total Bilirubin: 0.6 mg/dL (ref 0.3–1.2)
Total Protein: 7.1 g/dL (ref 6.5–8.1)

## 2023-02-08 LAB — URINALYSIS, ROUTINE W REFLEX MICROSCOPIC
Bilirubin Urine: NEGATIVE
Glucose, UA: NEGATIVE mg/dL
Hgb urine dipstick: NEGATIVE
Ketones, ur: 20 mg/dL — AB
Nitrite: NEGATIVE
Protein, ur: NEGATIVE mg/dL
Specific Gravity, Urine: 1.008 (ref 1.005–1.030)
pH: 8 (ref 5.0–8.0)

## 2023-02-08 LAB — CBG MONITORING, ED: Glucose-Capillary: 92 mg/dL (ref 70–99)

## 2023-02-08 LAB — CBC WITH DIFFERENTIAL/PLATELET
Abs Immature Granulocytes: 0.01 10*3/uL (ref 0.00–0.07)
Basophils Absolute: 0 10*3/uL (ref 0.0–0.1)
Basophils Relative: 0 %
Eosinophils Absolute: 0.2 10*3/uL (ref 0.0–0.5)
Eosinophils Relative: 3 %
HCT: 31.5 % — ABNORMAL LOW (ref 36.0–46.0)
Hemoglobin: 10.5 g/dL — ABNORMAL LOW (ref 12.0–15.0)
Immature Granulocytes: 0 %
Lymphocytes Relative: 33 %
Lymphs Abs: 1.6 10*3/uL (ref 0.7–4.0)
MCH: 28.8 pg (ref 26.0–34.0)
MCHC: 33.3 g/dL (ref 30.0–36.0)
MCV: 86.5 fL (ref 80.0–100.0)
Monocytes Absolute: 0.4 10*3/uL (ref 0.1–1.0)
Monocytes Relative: 8 %
Neutro Abs: 2.7 10*3/uL (ref 1.7–7.7)
Neutrophils Relative %: 56 %
Platelets: 157 10*3/uL (ref 150–400)
RBC: 3.64 MIL/uL — ABNORMAL LOW (ref 3.87–5.11)
RDW: 13.1 % (ref 11.5–15.5)
WBC: 4.9 10*3/uL (ref 4.0–10.5)
nRBC: 0 % (ref 0.0–0.2)

## 2023-02-08 LAB — HCG, SERUM, QUALITATIVE: Preg, Serum: NEGATIVE

## 2023-02-08 LAB — TSH: TSH: 3.395 u[IU]/mL (ref 0.350–4.500)

## 2023-02-08 LAB — MAGNESIUM: Magnesium: 1.9 mg/dL (ref 1.7–2.4)

## 2023-02-08 LAB — T4, FREE: Free T4: 0.94 ng/dL (ref 0.61–1.12)

## 2023-02-08 MED ORDER — KETOROLAC TROMETHAMINE 30 MG/ML IJ SOLN
30.0000 mg | Freq: Once | INTRAMUSCULAR | Status: AC
  Administered 2023-02-08: 30 mg via INTRAVENOUS
  Filled 2023-02-08: qty 1

## 2023-02-08 MED ORDER — DIPHENHYDRAMINE HCL 50 MG/ML IJ SOLN
12.5000 mg | Freq: Once | INTRAMUSCULAR | Status: AC
  Administered 2023-02-08: 12.5 mg via INTRAVENOUS
  Filled 2023-02-08: qty 1

## 2023-02-08 MED ORDER — METOCLOPRAMIDE HCL 5 MG/ML IJ SOLN
5.0000 mg | Freq: Once | INTRAMUSCULAR | Status: AC
  Administered 2023-02-08: 5 mg via INTRAVENOUS
  Filled 2023-02-08: qty 2

## 2023-02-08 NOTE — ED Provider Notes (Signed)
[facility] Provider Note   CSN: [id number] Arrival date & time: 02/08/23  1607     History  Chief Complaint  Patient presents with   Seizures    Shirley Anderson is a 42 y.o. 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  Reports she has been noncompliant with her medications for a very long time because she was getting mixed reports from different doctors.  She used to be on Tegretol daily.  Patient states that she has not been feeling well for the past few weeks including bad headaches, dizziness, sore neck and jaw muscles.  Patient had a witnessed 1 to 2-minute tonic-clonic seizure at work.  She went to an urgent care where she had a second seizure and EMS was called.  She had a third seizure with EMS.  She was given Versed IV prior to arrival with resolution of her symptoms.  She was postictal for EMS but has improved and is now back to baseline.  Patient also reports she has been off of her thyroid medications for the last week because it did not come in the mail.   Seizures      Home Medications Prior to Admission medications   Medication Sig Start Date End Date Taking? Authorizing Provider  acetaminophen (TYLENOL) 325 MG tablet Take 650 mg by mouth every 6 (six) hours as needed for mild pain.    [provider]  amphetamine-dextroamphetamine (ADDERALL XR) 20 MG 24 hr capsule Take 20 mg by mouth daily.    [provider]  Hypromellose (ARTIFICIAL TEARS OP) Place 1 drop into both eyes daily as needed (dry eyes).    [provider]  ibuprofen (ADVIL,MOTRIN) 200 MG tablet Take 200 mg by mouth every 6 (six) hours as needed for moderate pain.    [provider]  Magnesium 250 MG TABS Take 500 mg by mouth daily.     [provider]   Multiple Vitamin (MULTIVITAMIN) tablet Take 1 tablet by mouth daily.    [provider]  SYNTHROID 50 MCG tablet Take 50 mcg by mouth daily.  12/09/14   [provider]  topiramate (TOPAMAX) 100 MG tablet Take 200 mg by mouth 2 (two) times daily. Take 1.5 tablets by mouth twice daily    [provider]      Allergies    Carbamazepine, Keppra [levetiracetam], Lacosamide, Lamictal [lamotrigine], and Nuvigil [armodafinil]    Review of Systems   Review of Systems  Neurological:  Positive for seizures.    Physical Exam Updated Vital Signs There were no vitals taken for this visit. Physical Exam Vitals and nursing note reviewed.  Constitutional:      General: She is not in acute distress.    Appearance: She is well-developed. She is not diaphoretic.  HENT:     Head: Normocephalic and atraumatic.     Right Ear: External ear normal.     Left Ear: External ear normal.     Nose: Nose normal.     Mouth/Throat:     Mouth: Mucous membranes are moist.  Eyes:     General: No scleral icterus.    Conjunctiva/sclera: Conjunctivae normal.  Cardiovascular:     Rate and Rhythm: Normal rate and regular rhythm.     Heart sounds: Normal heart sounds. No murmur heard.    No friction rub. No  gallop.  Pulmonary:     Effort: Pulmonary effort is normal. No respiratory distress.     Breath sounds: Normal breath sounds.  Abdominal:     General: Bowel sounds are normal. There is no distension.     Palpations: Abdomen is soft. There is no mass.     Tenderness: There is no abdominal tenderness. There is no guarding.  Musculoskeletal:     Cervical back: Normal range of motion.  Skin:    General: Skin is warm and dry.  Neurological:     Mental Status: She is alert and oriented to person, place, and time.  Psychiatric:        Behavior: Behavior normal.     ED Results / Procedures / Treatments   Labs (all labs ordered are listed, but only abnormal results are  displayed) Labs Reviewed  COMPREHENSIVE METABOLIC PANEL  CBC WITH DIFFERENTIAL/PLATELET  HCG, SERUM, QUALITATIVE  MAGNESIUM  URINALYSIS, ROUTINE W REFLEX MICROSCOPIC  TSH  T3, FREE  T4, FREE  CBG MONITORING, ED    EKG None  Radiology No results found.  Procedures Procedures    Medications Ordered in ED Medications - No data to display  ED Course/ Medical Decision Making/ A&P                                 Medical Decision Making 42 year old female with a past medical history of seizure disorder and brain mass who had 3 witnessed tonic-clonic episodes today.The differential diagnosis for includes but is not limited to idiopathic seizure, traumatic brain injury, intracranial hemorrhage, vascular lesion, mass or space containing lesion, degenerative neurologic disease, congenital brain abnormality, infectious etiology such as meningitis, encephalitis or abscess, metabolic disturbance including hyper or hypoglycemia, hyper or hyponatremia, hyperosmolar state, uremia, hepatic failure, hypocalcemia, hypomagnesemia.  Toxic substances such as cocaine, lidocaine, antidepressants, theophylline, alcohol withdrawal, drug withdrawal, eclampsia, hypertensive encephalopathy and anoxic brain injury. ' Review of EMR shows multiple admissions previously for EEG monitoring in which these seizure-like events did not produce seizure activity on EEG.  I suggest nonepileptic seizure disorder.  Patient states that she has not been on any antiseizure medications for the last 2 years.  She has having a very bad headache and states that she has been having this for the past month.  She was treated in the emergency department with Toradol, Reglan and Benadryl with complete resolution of her headache.  Was observed in the emergency department for 7 hours.  Review of labs shows no significant abnormalities, no electrolyte disturbances suggestive of underlying cause of her seizures today.  Patient also does  not have an elevated lactic acid suggestive of seizure.  Normal CK today.   After long discussion with the patient she has a very complicated history and I feel the best for her to be in discussion with her neurologist for further treatment as she is currently not interested in repeat starting her Tegretol.  I have discussed need for emergent return, driving or precautions and patient feels safe with discharge with her husband at this time.  Amount and/or Complexity of Data Reviewed Labs: ordered. Radiology: ordered.  Risk Prescription drug management.           Final Clinical Impression(s) / ED Diagnoses Final diagnoses:  Seizure (HCC)  Bad headache    Rx / DC Orders ED Discharge Orders     None         Arthor Captain,  PA-C 02/09/23 1536    Durwin Glaze, MD 02/11/23 [phone]

## 2023-02-08 NOTE — Discharge Instructions (Signed)
Contact a health care provider if: You have another seizure or seizures. Call each time you have a seizure. You have a change in how often or when you have seizures. You keep having seizures with treatment. You have symptoms of being sick or having an infection. You are not able to take your medicine. Get help right away if: You have or someone has seen you have: A seizure that lasts longer than 5 minutes. Many seizures in a row and you don't feel better between seizures. A seizure that makes it harder to breathe. A seizure that leaves you unable to speak or use a part of your body. You didn't wake up right away after a seizure. You injure yourself during a seizure. You have confusion or pain right after a seizure. These symptoms may be an emergency. Call 911 right away.   Department of Motor Vehicle [facility]) of [place] regulations for seizures - It is the patient's responsibility to report the incidence of the seizure in the state of [place]. [place] has no statutory provision requiring physicians to report patients diagnosed with epilepsy or seizures to a central state agency.  The recommended DMV regulation requirement for a driver in [place] for an individual with a seizure is that they be seizure-free for 6-12 months. However, the DMV may consider the following exceptions to this general rule where: (1) a physician-directed change in medication causes a seizure and the individual immediately resumes the previous therapy which controlled seizures; (2) there is a history of nocturnal seizures or seizures which do not involve loss of consciousness, loss of control of motor function, or loss of appropriate sensation and information process; and (3) an individual has a seizure disorder preceded by an aura (warning) lasting 2-3 minutes. While the [facility] may also give consideration to other unusual circumstances which may affect the general requirement that drivers be seizure-free for 6-12 months, interpretation  of these circumstances and assignment of restrictions is at the discretion of the Medical Advisor. The DMV also considers compliance with medical therapy essential for safe driving. [facility] [place] Physician's Guide to [employer] (June, 1995 ed.)] The Department learns of an individual's condition by inquiring on the application form or renewal form, a physician's report to the [facility], an accident report or from correspondence from the individual. The person may be required to submit a Medical Report Form either annually or semi-annually.  Do not drive, swim, take baths or do any other activities that would be dangerous if you had another seizure.   Contact a health care provider if: You have another seizure. You have seizures more often. Your seizure symptoms change. You continue to have seizures with treatment. You have symptoms of an infection or illness. They might increase your risk of having a seizure. Get help right away if: You have a seizure: That lasts longer than 5 minutes. That is different than previous seizures. That leaves you unable to speak or use a part of your body. That makes it harder to breathe. After a head injury. You have: Multiple seizures in a row. Confusion or a severe headache right after a seizure. You are having seizures more often. You do not wake up immediately after a seizure. You injure yourself during a seizure.

## 2023-02-08 NOTE — ED Triage Notes (Signed)
Pt arrives via EMS. Pt had a total of 3 seizures pta. 1st was at her work, she had a 2nd at an UC that lasted 1-2 mins. Her 3rd was after EMS arrived. She had a tonic-clonic seizure with them. EMS administered 2.5mg  of midazolam. Pt arrives to ED AxOx4. She c/o dizziness and fatigue. PT states she has a benign brain tumor and is supposed to be taking seizure meds but has been non-compliant for several months.

## 2023-02-09 LAB — T3, FREE: T3, Free: 2.7 pg/mL (ref 2.0–4.4)
